# Patient Record
Sex: Male | Born: 1955
Health system: Southern US, Community
[De-identification: ages and names within clinical notes are randomized; demographics above are authoritative.]

## PROBLEM LIST (undated history)

## (undated) DIAGNOSIS — N200 Calculus of kidney: Secondary | ICD-10-CM

## (undated) DIAGNOSIS — Z87442 Personal history of urinary calculi: Secondary | ICD-10-CM

## (undated) DIAGNOSIS — E7800 Pure hypercholesterolemia, unspecified: Secondary | ICD-10-CM

## (undated) DIAGNOSIS — E119 Type 2 diabetes mellitus without complications: Secondary | ICD-10-CM

## (undated) DIAGNOSIS — M1990 Unspecified osteoarthritis, unspecified site: Secondary | ICD-10-CM

## (undated) HISTORY — PX: NECK SURGERY: SHX720

## (undated) HISTORY — PX: KNEE SURGERY: SHX244

## (undated) HISTORY — PX: APPENDECTOMY: SHX54

## (undated) HISTORY — PX: BACK SURGERY: SHX140

---

## 2001-09-11 ENCOUNTER — Inpatient Hospital Stay (HOSPITAL_COMMUNITY): Admission: RE | Admit: 2001-09-11 | Discharge: 2001-09-12 | Payer: Self-pay | Admitting: Neurosurgery

## 2001-09-11 ENCOUNTER — Encounter: Payer: Self-pay | Admitting: Neurosurgery

## 2003-02-09 ENCOUNTER — Encounter: Payer: Self-pay | Admitting: Neurosurgery

## 2003-02-11 ENCOUNTER — Encounter: Payer: Self-pay | Admitting: Neurosurgery

## 2003-02-11 ENCOUNTER — Inpatient Hospital Stay (HOSPITAL_COMMUNITY): Admission: RE | Admit: 2003-02-11 | Discharge: 2003-02-13 | Payer: Self-pay | Admitting: Neurosurgery

## 2005-04-07 ENCOUNTER — Encounter: Admission: RE | Admit: 2005-04-07 | Discharge: 2005-04-07 | Payer: Self-pay | Admitting: Orthopedic Surgery

## 2007-07-09 ENCOUNTER — Ambulatory Visit (HOSPITAL_BASED_OUTPATIENT_CLINIC_OR_DEPARTMENT_OTHER): Admission: RE | Admit: 2007-07-09 | Discharge: 2007-07-09 | Payer: Self-pay | Admitting: Orthopedic Surgery

## 2007-08-06 ENCOUNTER — Ambulatory Visit (HOSPITAL_BASED_OUTPATIENT_CLINIC_OR_DEPARTMENT_OTHER): Admission: RE | Admit: 2007-08-06 | Discharge: 2007-08-06 | Payer: Self-pay | Admitting: Orthopedic Surgery

## 2007-11-21 ENCOUNTER — Ambulatory Visit: Payer: Self-pay | Admitting: Family Medicine

## 2007-11-21 ENCOUNTER — Inpatient Hospital Stay (HOSPITAL_COMMUNITY): Admission: EM | Admit: 2007-11-21 | Discharge: 2007-11-22 | Payer: Self-pay | Admitting: Emergency Medicine

## 2007-11-28 ENCOUNTER — Encounter: Admission: RE | Admit: 2007-11-28 | Discharge: 2007-11-28 | Payer: Self-pay | Admitting: Neurosurgery

## 2010-01-18 ENCOUNTER — Encounter: Admission: RE | Admit: 2010-01-18 | Discharge: 2010-01-18 | Payer: Self-pay | Admitting: Neurosurgery

## 2010-10-23 NOTE — H&P (Signed)
NAME:  Christopher Hayden, Christopher Hayden                  ACCOUNT NO.:  [id number]   MEDICAL RECORD NO.:  [id number]          PATIENT TYPE:  INP   LOCATION:  3739                         FACILITY:  [facility]   PHYSICIAN:  Wayne A. Sheffield Slider, M.D.    DATE OF BIRTH:  [birth year]   DATE OF ADMISSION:  11/21/2007  DATE OF DISCHARGE:                              HISTORY & PHYSICAL   CHIEF. COMPLAINT:  Chest pain.   HISTORY OF PRESENT ILLNESS:  A 55 year old white male with left arm  pain, jaw pain, head pain, face tingling, and malaise; all these started  this morning accompanied by a chest tightness that has come on gradually  over the day and is worse with deep inspiration.  His blood sugar this  morning was 126.  He also has a history of diabetes and hyperlipidemia.  Chest pain and his other symptoms had improved with nitroglycerin and  morphine.  He does have a history of neck surgery and some cervical  degenerative disease.   PAST MEDICAL HISTORY:  1. Diabetes mellitus x10-14 years.  2. Hyperlipidemia.  3. Chronic neck pain from degeneration of neck.  Per the patient, he      has had a stress test which was about 20 years ago, although his      wife thinks it is more like 10 years ago.   PAST SURGICAL HISTORY:  1. Left shoulder surgery in January 2009.  2. Right knee surgery in February 2009.  3. Right shoulder surgery in 2007.  4. Neck surgery in 2003 and 2004 for disk disease.   SOCIAL HISTORY:  The patient lives in [place], [place].  He is  on disability.  He previously worked in [employer].  He is  married.  He denies tobacco, alcohol, or drug use.   FAMILY HISTORY:  Mother has diabetes, and father has history of a heart-  valve surgery but since then, apparently, has not been on any  medications.   REVIEW OF SYSTEMS:  No fevers.  No sweats.  Positive for headache.  Positive for chest pain as above.  Negative for shortness of breath.  Positive for abdominal pain.  Negative for  vomiting and diarrhea.  Negative for dysuria and rash.  Positive for arthralgias which are  chronic.  Negative for vision changes, weakness, or numbness.   ALLERGIES:  1. GLUCOPHAGE.  2. NAPROSYN.   MEDICATIONS:  1. Actos 45 daily.  2. Amaryl 2 mg in the morning and 6 mg at night.  3. Lantus 10 units at bedtime.  4. Pravachol 20 mg daily.  5. Glucosamine/chondroitin daily.   PHYSICAL EXAMINATION:  VITAL SIGNS:  Temperature 98.7, pulse 64 to 92,  respirations 16-18, blood pressure 106 to 128 over 64 to 79, and pulse  ox 100% on room air.  GENERAL:  No acute distress.  HEENT:  Normal external ears, nose, throat, and mouth.  NECK:  Posterior neck scar.  Negative Spurling.  CARDIOVASCULAR:  Regular rate and rhythm.  No murmurs.  No JVD.  LUNGS:  Clear to auscultation bilaterally.  ABDOMEN:  Soft,  nontender, normal bowel sounds.  No hepatosplenomegaly.  BACK:  Nontender over the entire spine.  EXTREMITY:  No edema.  NEUROLOGICAL:  Cranial nerves II through XII intact.  Grossly normal  sensation and movement.  MUSCULOSKELETAL:  The patient is moving all of his extremities.  He has  a posterior neck scar and a negative Spurling test as above.   LABORATORY DATA:  Sodium 139, potassium 4.1, chloride 106, bicarbonate  26, glucose 250, creatinine 0.8, BUN 19, hemoglobin 14.3, hematocrit  42.5, white count 4.8, and platelets 199.  Differential for the CBC was  normal.  Chest x-ray was normal and EKG was normal sinus rhythm.   ASSESSMENT AND PLAN:  A 55 year old white male with history of diabetes  mellitus and hyperlipidemia who presents with chest tightness.  1. Chest pain.  The patient has multiple risk factors; however, I      think he is relatively low risk for acute event at this time,      especially given completely normal workup thus far.  We will go and      treat with aspirin and heparin.  We will hold on beta-blocker      secondary to his relative bradycardia with a pulse of  58 on the      monitor currently.  We will check a cardiac panel every 8 hours x3      and a morning electrocardiogram.  The patient will likely need      outpatient risk stratification.  We will also check a.m. fasting      lipid profile.  2. Diabetes mellitus.  He has home medications.  We will also check a      hemoglobin A1c.  It is an odd regimen, especially of his Amaryl,      but we will defer this management to his primary care physician and      just keep a eye on his CBGs today.  3. Hyperlipidemia.  We will check a fasting lipid panel and continue      the Pravachol.  4. Osteoarthritis.  We will continue glucosamine and chondroitin.   DISPOSITION:  1. Tentative discharge tomorrow if cardiac enzymes are negative.  2. Neck pain:  This may be a manifestation of his neck degenerative      disease that has worsened since his main complaint and symptoms      seem to stem from his left arm pain and tingling et Karie Soda.      Angeline Slim, M.D.  Electronically Signed      Wayne A. Sheffield Slider, M.D.  Electronically Signed    AL/MEDQ  D:  11/21/2007  T:  11/22/2007  Job:  [id number]   cc:   Fredia Beets, M.D.

## 2010-10-23 NOTE — Op Note (Signed)
NAMEMarland Hayden  [name], MALLOZZI                  ACCOUNT NO.:  [id number]   MEDICAL RECORD NO.:  [id number]          PATIENT TYPE:  AMB   LOCATION:  DSC                          FACILITY:  [facility]   PHYSICIAN:  Rodney A. Mortenson, M.D.DATE OF BIRTH:  1955/08/30   DATE OF PROCEDURE:  08/06/2007  DATE OF DISCHARGE:                               OPERATIVE REPORT   JUSTIFICATION:  A 55 year old male who first injured his right knee on a  boat ramp on December 26, 2006.  He had tenderness along the medial joint  line, initially thought to be a mild contusion.  He continues to have  diffuse discomfort about the knee.  Occasionally, the knee will catch  and pop on him and this seems to relieve his discomfort.  He has had  intermittent problems with the knee and examination shows no significant  joint line tenderness.  There is a small medial plica which is  nontender.  He has crepitus behind the patella.  Because of persistent  pain and discomfort, an MRI of the knee was obtained and this shows a  grade 3 chondromalacia of the patellofemoral junction and medial  compartments.  There are 2, possibly 3 loose bodies in the joint.  No  discrete meniscal tears are seen.  Because of persistent pain and  discomfort, it is felt that arthroscopic evaluation and treatment are  indicated and necessary.  Complication were discussed preoperatively.  Questions answered and encouraged and the patient wishes to proceed.   JUSTIFICATION FOR OUTPATIENT SURGERY:  Minimal morbidity.   PREOPERATIVE DIAGNOSIS:  Loose bodies, right knee; chondromalacia,  patellofemoral junction.   POSTOPERATIVE DIAGNOSES:  1. Multiple horizontal tears, posterior horn of medial meniscus, right      knee.  2. Early osteoarthritis, trochlear notch, with some full-thickness      loss of cartilage and trochlear notch, right femur, chondromalacia      patella, small cartilaginous loose bodies.   OPERATION:  Arthroscopy, chondroplasty of patella,  chondroplasty of the  femoral notch area and debridement of loose flaking cartilage from  trochlear area.  A partial medial meniscectomy with debridement of  posterior horn of medial meniscus and chondroplasty of medial femoral  condyle.   SURGEON:  Lenard Galloway. Chaney Malling, M.D.   ANESTHESIA:  General.   PATHOLOGY:  With the arthroscopy in the knee, a very careful examination  of the knee was undertaken.  There was grade 2 chondromalacia in the  posterior aspect of the patella, but in the femoral notch area, there  were 2 large vertical troughs of total loss of articular cartilage loss.  In the interval between the 2 vertical troughs, there is grade 2 and  grade 3 cartilage damage.  The anterior cruciate ligament was normal.  In the lateral compartment, there was normal articular cartilage of the  lateral femoral condyle, the lateral tibial plateau and the entire  circumference of the lateral meniscus was normal, but there were 2 small  cartilaginous loose bodies in this area.  In the medial compartment,  there were multiple horizontal tears of the posterior horn of  the medial  meniscus and grade 2 and grade 3 cartilage damage off the medial femoral  condyle.   PROCEDURE:  The patient was placed on the operating table in a supine  position with a pneumatic tourniquet about the right upper thigh.  The  right leg was placed in a leg holder and the entire right lower  extremity prepped with DuraPrep and draped out in the usual manner.  An  infusion cannula was placed in the superomedial pouch and the knee  distended with saline.  Anteromedial and anterolateral portals were made  and the arthroscope was introduced.  Attention was first turned to the  posterior aspect of the patella, which was debrided with a full-radius  dissector.  There was significant fraying and tearing of articular  cartilage and trochlear notch here and this was also very aggressively  debrided.  There were some areas  where there was total loss of articular  cartilage and exposed raw bone.  In the lateral compartment, several  small cartilaginous loose bodies were seen and these were removed with  the intra-articular shaver.  Attention was then turned to the medial  compartment.  Through both medial and lateral portals, a series of  baskets were inserted and the posterior third of the posterior  attachment was debrided with baskets; this was followed with the intra-  articular shaver.  The remaining rim was then smoothed and balanced with  a nice transition to the anterior two-thirds of the medial meniscus,  which appeared normal.  A chondroplasty shaver was then used to clean  off the medial femoral condyle.  The knee was then filled with Marcaine  and a large bulky pressure dressing was applied.  The patient returned  to the recovery room in excellent condition.  Technically, this  procedure went extremely well.   DISPOSITION:  1. To my office on Friday.  2. Percocet for pain.  3. Usual postoperative instructions were given.      Rodney A. Chaney Malling, M.D.  Electronically Signed     RAM/MEDQ  D:  08/06/2007  T:  08/07/2007  Job:  [id number]

## 2010-10-23 NOTE — Op Note (Signed)
NAMEMarland Kitchen  Christopher Hayden, Christopher Hayden                  ACCOUNT NO.:  [id number]   MEDICAL RECORD NO.:  [id number]          PATIENT TYPE:  AMB   LOCATION:  DSC                          FACILITY:  [facility]   PHYSICIAN:  Rodney A. Mortenson, M.D.DATE OF BIRTH:  07/22/1955   DATE OF PROCEDURE:  07/09/2007  DATE OF DISCHARGE:                               OPERATIVE REPORT   JUSTIFICATION:  55 year old male who has been having trouble with his  left shoulder for a number of months.  He has been treated  conservatively and has never had complete resolution of pain and the  problem continuous.  An MRI was obtained and this shows moderate  anterior supraspinatus tendinopathy with AC joint arthropathy.  He has  good strength, internal and external rotation, no tenderness over the [facility]  joint, minimal tenderness of the biceps but impingement testing is very  painful.  It was felt that he had an impingement syndrome to the  shoulder and that surgical intervention was indicated necessary.  Complications were discussed preoperatively.  Questions were answered  and encouraged and the patient wishes to proceed.   JUSTIFICATION FOR OUTPATIENT SURGERY:  Minimal morbidity.   PREOPERATIVE DIAGNOSIS:  1. Impingement syndrome, left shoulder.  2. Supraspinatus tendinopathy  3. Rule out internal derangement glenohumeral joint.   POSTOPERATIVE DIAGNOSIS:  Frayed superior labrum, left shoulder;  chondral defect humeral head; supraspinatus tendinopathy secondary to  impingement syndrome, left shoulder.   OPERATION:  Arthroscopic debridement of labrum; subacromial  decompression arthroscopically and distal clavicle resection on inferior  surface with removal of inferior bone spurs.   SURGEON:  Lenard Galloway. Chaney Malling, M.D.   ANESTHESIA:  General.   PROCEDURE:  The patient was placed on the operating table in a supine  position.  After satisfactory general anesthesia, was placed in the semi-  sitting position.  The left upper  extremity and shoulder was then  prepped with DuraPrep and draped out in the usual manner.  Through a  standard posterior portal, an arthroscope was introduced and a standard  anterior portal for instrumentation was done.  A very careful  examination of the shoulder was done.  The biceps appeared absolutely  normal.  The subscapularis was normal.  The anterior and inferior labrum  was normal, but the superior labrum was frayed and torn but this was  stable and the biceps was absolutely normal.  No injury or fraying or  tearing of the supraspinatus could be seen.  There was a chondral defect  about the humeral head.  The arthroscope was then placed in the  subacromial space and evaluated.  As noted previously, the torn labrum  was debrided.  In the subacromial space, there was some prominence of  the inferior aspect of the distal clavicle.  Using the ArthroCare wand,  the soft tissue was debrided back out of the way exposing bone at the  distal end of the clavicle and the acromion.  The borders of the  acromion were very clearly seen.  A 6 mm bur was then inserted and a  subacromial decompression was done with the bur.  Excellent  decompression was achieved.  There was significant fraying of the bursal  surface of the supraspinatus but no full thickness tear could be seen.  Next, attention was turned to the under surface of the distal clavicle  and this was also debrided with a 6 mm bur.  The bone spurs at this  level were then debrided back and debulked.  Once this was completed,  there was excellent decompression of the cuff itself.  The arthroscope  was removed.  The skin was closed with 3-0 nylon suture, a sterile  dressing was applied, and the patient returned to the recovery room in  excellent condition.  Technically, this went extremely well.   DISPOSITION:  1. Percocet for pain.  2. To my office next week.  3. The usual postop instructions were given.  4. Start physical therapy  when he is seen in the office.      Rodney A. Chaney Malling, M.D.  Electronically Signed     RAM/MEDQ  D:  07/09/2007  T:  07/09/2007  Job:  [id number]

## 2010-10-23 NOTE — Discharge Summary (Signed)
NAME:  Christopher Hayden, Christopher Hayden                  ACCOUNT NO.:  [id number]   MEDICAL RECORD NO.:  [id number]          PATIENT TYPE:  INP   LOCATION:  3739                         FACILITY:  [facility]   PHYSICIAN:  Wayne A. Sheffield Slider, M.D.    DATE OF BIRTH:  02/12/1956   DATE OF ADMISSION:  11/21/2007  DATE OF DISCHARGE:  11/22/2007                               DISCHARGE SUMMARY   DISCHARGE DIAGNOSES:  1. Bilateral jaw pain with left arm pain and tingling as well as chest      tightness likely secondary to cervical spine pathology.  2. Diabetes.  3. Hypercholesterolemia.  4. Arthritis.  5. History of multiple shoulder, knee, and neck surgeries.  6. Chronic neck pain.   DISCHARGE MEDICATIONS:  1. Actos 2 mg in the morning and 6 mg at night.  2. Lantus 10 units at bedtime.  3. Pravachol 20 mg daily.  4. Aleve 2 tablets in the morning and 2 tablets at night.   FOLLOWUP INSTRUCTIONS:  The patient is to follow up with Dr. Channing Mutters in [place] regarding his chronic neck pain.  His primary care physician is  Dr. Lendon Colonel in [facility] and he will call him for a followup appointment.   HOSPITAL COURSE:  This is a 55 year old white male with a history of  diabetes, hyperlipidemia, and chronic neck pain who was admitted to the  hospital for a 1-day history of left arm pain, jaw pain, face tingling,  and some mild chest tightness upon deep inspiration.  Given his coronary  artery risk factors, he was admitted to the hospital to rule out  myocardial infarction.  1. Rule out MI.  The patient had an EKG on admission and again the      next morning that both showed sinus bradycardia but were otherwise      normal.  He had cardiac enzymes that were negative x3 sets.  He was      initially started on a heparin drip for chest pain, but this was      discontinued on the morning of discharge given his negative workup.  2. Diabetes.  The patient was continued on his home medicines of      Lantus and Amaryl with a sensitive  sliding scale regimen.  He had      adequate glucose control throughout admission without any      hypoglycemia.  3. Hyperlipidemia.  He was continued on his home dose of Pravachol.  A      fasting lipid panel was obtained that showed HDL of 49, LDL of 96,      and triglycerides of 34.  4. Chronic neck pain.  The patient is followed by Dr. Channing Mutters and has had      neck surgeries in 2003 and 2004 for a ruptured disk.  He follows      with Dr. Channing Mutters every 6 months and actually just saw him the week      prior to admission after a fall that occurred on June 8.  He has      had  increasing pain in his neck since that fall on June 8 and has      been taking Aleve 2 tablets in the morning and 2 tablets at night.      He is unable to tolerate most of narcotic pain medications due to      either sedation and nausea.  The patient says that Dr. Channing Mutters plans to      obtain another MRI of      his neck if his pain does not get better with Aleve after this most      recent on.  It is highly suspected that the left arm pain, jaw      pain, and head pain could possibly be a cervical disk pathology.      The patient will need close follow up with Dr. Channing Mutters for better pain      control.      Sylvan Cheese, M.D.  Electronically Signed      Wayne A. Sheffield Slider, M.D.  Electronically Signed    MJ/MEDQ  D:  11/22/2007  T:  11/22/2007  Job:  [id number]   cc:   Fredia Beets, M.D.  Payton Doughty, M.D.

## 2010-10-26 NOTE — Op Note (Signed)
   NAME:  Christopher Hayden, Christopher Hayden                            ACCOUNT NO.:  [id number]   MEDICAL RECORD NO.:  [id number]                   PATIENT TYPE:  INP   LOCATION:  2896                                 FACILITY:  [facility]   PHYSICIAN:  Payton Doughty, M.D.                   DATE OF BIRTH:  Dec 26, 1955   DATE OF PROCEDURE:  02/11/2003  DATE OF DISCHARGE:                                 OPERATIVE REPORT   PREOPERATIVE DIAGNOSIS:  Nonunion C5-6.   POSTOPERATIVE DIAGNOSIS:  Nonunion C5-6.   PROCEDURE:  C5-6 posterior cervical fusion with lateral mass screws.   SURGEON:  Payton Doughty, M.D.   ANESTHESIA:  General endotracheal anesthesia.   PREPARATION:  Alcohol wipe.   COMPLICATIONS:  None.   ASSISTANT:  Clydene Fake, M.D.   INDICATIONS FOR PROCEDURE:  This is a 55 year old gentleman who had a fusion  16 months ago and now has a nonunion at C5-6.   DESCRIPTION OF PROCEDURE:  The patient was taken to the operating room and  intubated. He was placed prone in the [place] head holder. Following  shaving, prepping and draping in the usual sterile fashion the skin was  incised from the bottom of C4 to the top of C7 and the lamina of C5 and C6  were exposed bilaterally in a subperiosteal plane. Interoperative x-ray  confirmed the correctness of the level.   After confirming the correctness of the level  the bone was scraped free of  periosteum. Lateral mass screws were then placed using the standard  coordinates. They were 1 cm long and 2.5 mm diameter. Interoperative x-rays  showed good placement of screws. They were connected with a rod and cap.   Then DBX mixed with bone croutons was placed as a laminar and facet graft.  The wound was irrigated. Hemostasis was assured. The fascia was  reapproximated with 0 Vicryl in an interrupted fashion. The subcutaneous  tissue was reapproximated with 0 Vicryl in an interrupted fashion. The  subcuticular tissue  was reapproximated with 3-0 Vicryl in an  interrupted  fashion. The skin was closed with 3-0 nylon in a running locked fashion.  Betadine and Telfa dressing were applied and then closed with OpSite. The  patient was placed in an Aspen collar and returned to the recovery room in  good condition.                                                Payton Doughty, M.D.   MWR/MEDQ  D:  02/11/2003  T:  02/12/2003  Job:  [id number]

## 2010-10-26 NOTE — Op Note (Signed)
[place]  Patient:    JOHNTHAN, AXTMAN Visit Number: [id number] MRN: [id number]          Service Type: SUR Location: [id number] Attending Physician:  Emeterio Reeve Dictated by:   Payton Doughty, M.D. Proc. Date: 09/11/01 Admit Date:  09/11/2001                             Operative Report  PREOPERATIVE DIAGNOSIS:  Herniated disk, C5-6.  POSTOPERATIVE DIAGNOSIS:  Herniated disk, C5-6.  PROCEDURE:  C5-6 anterior cervical diskectomy and fusion with Tether plate.  SURGEON:  Payton Doughty, M.D.  NURSE ASSISTANT:  [facility]  DOCTOR ASSISTANT:  Tanya Nones. Jeral Fruit, M.D.  ANESTHESIA:  General endotracheal.  PREPARATION:  Sterile Betadine prep and scrub with alcohol wipe.  COMPLICATIONS:  None.  DESCRIPTION OF PROCEDURE:  A 55 year old right-handed white gentleman with herniated disk at 5-6 and a left C6 radiculopathy.  He was taken to the operating room and smoothly anesthetized and intubated, placed supine on the operating table in the Holter head traction.  Following shave, prep, and drape in the usual sterile fashion, the skin was incised in the midline in the medial border of the sternocleidomastoid muscle on the left side.  The platysma was identified, elevated, divided, and undermined.  The sternocleidomastoid was identified, and medial dissection revealed the carotid artery retracted laterally to the left, trachea and esophagus retracted laterally to the right, exposing the bones of the anterior cervical spine.  A marker was placed, intraoperative x-ray obtained to confirm correctness of level.  Having confirmed correctness of the level, the longus colli was taken down bilaterally to the extent that it allowed visualization of the majority of the anterior curvature of the vertebral body.  Diskectomy at C5-6 was then carried out under gross observation.  The operating microscope was brought in and microdissection technique used to  dissect the remaining disk to free up the neural foramina and dissect the anterior epidural space.  On the left side there was a herniated disk compressing the origin of the left C6 root.  This was removed without difficulty.  The root was explored down to the neural foramen and found to be open.  On the right side there was no encumbering disk.  Having completed decompression, a 7 mm bone graft was fashioned from patellar allograft and tapped into place.  A 14 mm Tether plate was then placed with 13 mm screws, two in C5 and two in C6.  Intraoperative x-ray showed good placement of bone graft, plate, and screws.  The wound was irrigated and hemostasis assured.  The wound was reapproximated with 3-0 Vicryl in interrupted fashion, subcutaneous tissue was reapproximated with 3-0 Vicryl in interrupted fashion, and the skin was closed with 4-0 Vicryl in a running subcuticular fashion.  Benzoin and Steri-Strips were applied and made occlusive with Telfa and OpSite.  The patient then placed in an Aspen collar and returned to the recovery room in good condition. Dictated by:   Payton Doughty, M.D. Attending Physician:  Emeterio Reeve DD:  09/11/01 TD:  09/12/01 Job: [id number]

## 2010-10-26 NOTE — H&P (Signed)
NAME:  Christopher Hayden, Christopher Hayden                            ACCOUNT NO.:  [id number]   MEDICAL RECORD NO.:  [id number]                   PATIENT TYPE:  INP   LOCATION:  3005                                 FACILITY:  [facility]   PHYSICIAN:  Payton Doughty, M.D.                   DATE OF BIRTH:  1955-08-07   DATE OF ADMISSION:  02/11/2003  DATE OF DISCHARGE:                                HISTORY & PHYSICAL   ADMITTING DIAGNOSIS:  Nonunion C5-C6.   SERVICE:  Neurosurgery.   HISTORY OF PRESENT ILLNESS:  This is a 55 year old, right handed, white  gentleman who underwent a diskectomy in April 2003, did well and came back  to my office about a month ago with increasing pain in his neck and  headaches at the back of his neck.  Plain films and MR showed nonunion at C5-  C6 and he is going to undergo a posterior augmentation of his C5-C6 fusion.   MEDICAL HISTORY:  Remarkable for adult onset diabetes.   MEDICATIONS:  1. He takes Avandia 8 mg a day.  2. Wellbutrin 150 mg a day.  3. Chlorpropamide 250 mg a day.  4. Pravachol 20 mg a day.   ALLERGIES:  1. He is sensitive to NAPROSYN  2. GLUCOPHAGE.  3. ZETIA.   PAST SURGICAL HISTORY:  1. Appendectomy, in 1972.  2. Carpal tunnel, in 1985.   SOCIAL HISTORY:  He does not smoke.  He does not drink.  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   FAMILY HISTORY:  Mom is 77.  Dad is 25.  They are alive, histories are not  given.   REVIEW OF SYSTEMS:  Remarkable for glasses, sinus headaches, leg pain while  he is walking, hematuria, arm weakness, leg weakness, back pain, joint pain,  arthritis, neck pain, diabetes.   PHYSICAL EXAMINATION:  HEENT:  Within normal limits.  NECK:  He has reasonable range of motion of his neck with no crepitus.  CHEST:  Clear.  CARDIAC:  Regular, rate and rhythm.  ABDOMEN:  Nontender with no hepatosplenomegaly.  EXTREMITIES:  Without clubbing or cyanosis. Peripheral pulses are good.  GU:  Deferred.  NEUROLOGIC:  He is awake,  alert and oriented.  Cranial nerves are intact.  Motor exam shows 5/5 strength throughout the upper and lower extremities at  this time.  There is no current sensory deficit.  Deep tendon reflexes are 2  at the biceps, 1 at the triceps, 1 at the brachialis bilaterally.  Lower  extremities are nonmyelopathic.   IMAGING STUDIES:  Plain films and MR demonstrate a probable nonunion C5-C6.   CLINICAL IMPRESSION:  Nonunion C5-C6.   PLAN:  Posterior augmentation infusion.   The risks and benefits of this approach have been discussed with him and he  wishes to proceed.  Payton Doughty, M.D.    MWR/MEDQ  D:  02/11/2003  T:  02/11/2003  Job:  [id number]

## 2010-10-26 NOTE — H&P (Signed)
[place]  Patient:    Christopher Hayden, Christopher Hayden Visit Number: [id number] MRN: [id number]          Service Type: SUR Location: 3000 [id number] Attending Physician:  Emeterio Reeve Dictated by:   Payton Doughty, M.D. Admit Date:  09/11/2001 Discharge Date: 09/12/2001                           History and Physical  ADMITTING DIAGNOSIS:  Herniated disk at 5-6 on the left side.  SERVICE:  Neurosurgery.  HISTORY OF PRESENT ILLNESS:  [age]-year-old right-handed white gentleman who last summer began experiencing pain in his left arm; it started when he was moving objects and he noted he was having weakness of his biceps.  He visited orthopedic doctors and did not get any better.  MRI of the arm and shoulder was unremarkable.  MRI of the neck showed a disk eccentric to the left at C5-6 and he was referred to me.  MEDICAL HISTORY:  Remarkable for adult-onset diabetes.  MEDICATIONS: 1. Avandia 8 mg a day. 2. Wellbutrin 150 mg a day. 3. Chlorpropamide 250 mg a day. 4. Pravachol 20 mg a day.  ALLERGIES:  He is sensitive to NAPROSYN, GLUCOPHAGE and ZETIA.  SURGICAL HISTORY:  Appendectomy in 1972, carpal tunnel in 1980 or 1985.  SOCIAL HISTORY:  He does not smoke and does not drink.  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  FAMILY HISTORY:  His mom is 1, daddy is 54; histories are not given.  REVIEW OF SYSTEMS:  Remarkable for glasses, sinus headaches, leg pain while he is walking, hematuria, arm weakness, leg weakness, back pain, joint pain, arthritis, neck pain and diabetes.  PHYSICAL EXAMINATION:  HEENT:  Within normal limits.   NECK:  He has reasonable range of motion in his neck with no reproduction of his left arm symptoms.  CHEST:  Clear.  CARDIAC:  Regular rate and rhythm.  ABDOMEN:  Nontender with no hepatosplenomegaly.  EXTREMITIES:  Without clubbing or cyanosis.  GU:  Deferred.  PERIPHERAL PULSES:  Good.  NEUROLOGIC:  He is  awake, alert and oriented.  His cranial nerves are intact. Motor exam shows 5/5 strength throughout the upper extremities, save for slight weakness of the left biceps.  There is no tenderness of the biceps or extensor muscle mass and finger extensors are without discomfort in the extensor muscle mass.  Reflexes are 2 at the right biceps, 1 at the left, 1 at the brachioradialis and 1 at the triceps bilaterally.  Lower extremities are non-myelopathic.  LABORATORY AND ACCESSORY DATA:  MRI shows a spondylitic change at 3-4, a disk at 5-6, eccentric to the left, and a small central disk at 6-7.  CLINICAL IMPRESSION:  Mild left C6 radiculopathy related to herniated disk.  These have been bothering him for a while and wants to get it taken care of.  PLAN:  The plan is for an anterior cervical diskectomy and fusion at C5-C6 with a tethered plate.  The risks and benefits of this approach have been discussed with him and he wishes to proceed. Dictated by:   Payton Doughty, M.D. Attending Physician:  Emeterio Reeve DD:  09/11/01 TD:  09/11/01 Job: [id number]

## 2011-02-28 LAB — BASIC METABOLIC PANEL
BUN: 14
Creatinine, Ser: 0.88
GFR calc non Af Amer: >60
Glucose, Bld: 255 — ABNORMAL HIGH
Potassium: 4.2

## 2011-03-01 LAB — BASIC METABOLIC PANEL
BUN: 14
Creatinine, Ser: 0.84
GFR calc non Af Amer: >60
Glucose, Bld: 93
Potassium: 4.5

## 2011-03-07 LAB — POCT I-STAT, CHEM 8
BUN: 19
Calcium, Ion: 1.13
Chloride: 106
Glucose, Bld: 250 — ABNORMAL HIGH

## 2011-03-07 LAB — BASIC METABOLIC PANEL
BUN: 17
Chloride: 103
Glucose, Bld: 230 — ABNORMAL HIGH
Potassium: 4.7

## 2011-03-07 LAB — CARDIAC PANEL(CRET KIN+CKTOT+MB+TROPI)
Relative Index: 1.1
Troponin I: <0.01
Troponin I: <0.01

## 2011-03-07 LAB — APTT: aPTT: 29

## 2011-03-07 LAB — CBC
HCT: 38.4 — ABNORMAL LOW
HCT: 42.5
Hemoglobin: 14.3
MCHC: 33.8
Platelets: 177
Platelets: 199
RDW: 14
WBC: 6.9

## 2011-03-07 LAB — LIPID PANEL
Cholesterol: 152
HDL: 49

## 2011-03-07 LAB — TROPONIN I: Troponin I: <0.01

## 2011-03-07 LAB — DIFFERENTIAL
Basophils Absolute: 0
Basophils Relative: 0
Eosinophils Relative: 3
Monocytes Absolute: 0.5
Neutro Abs: 3.1

## 2011-03-07 LAB — PROTIME-INR
INR: 1
Prothrombin Time: 13.7

## 2011-03-07 LAB — HEPARIN LEVEL (UNFRACTIONATED)
Heparin Unfractionated: 0.87 — ABNORMAL HIGH
Heparin Unfractionated: 0.95 — ABNORMAL HIGH

## 2013-06-18 ENCOUNTER — Emergency Department (HOSPITAL_BASED_OUTPATIENT_CLINIC_OR_DEPARTMENT_OTHER): Payer: Managed Care, Other (non HMO)

## 2013-06-18 ENCOUNTER — Encounter (HOSPITAL_BASED_OUTPATIENT_CLINIC_OR_DEPARTMENT_OTHER): Payer: Self-pay | Admitting: Emergency Medicine

## 2013-06-18 ENCOUNTER — Emergency Department (HOSPITAL_BASED_OUTPATIENT_CLINIC_OR_DEPARTMENT_OTHER)
Admission: EM | Admit: 2013-06-18 | Discharge: 2013-06-19 | Disposition: A | Discharge status: Home / Self Care | Payer: Managed Care, Other (non HMO) | Attending: Emergency Medicine | Admitting: Emergency Medicine

## 2013-06-18 DIAGNOSIS — Z794 Long term (current) use of insulin: Secondary | ICD-10-CM | POA: Insufficient documentation

## 2013-06-18 DIAGNOSIS — E119 Type 2 diabetes mellitus without complications: Secondary | ICD-10-CM | POA: Insufficient documentation

## 2013-06-18 DIAGNOSIS — Z7982 Long term (current) use of aspirin: Secondary | ICD-10-CM | POA: Insufficient documentation

## 2013-06-18 DIAGNOSIS — R112 Nausea with vomiting, unspecified: Secondary | ICD-10-CM | POA: Insufficient documentation

## 2013-06-18 DIAGNOSIS — R1011 Right upper quadrant pain: Secondary | ICD-10-CM

## 2013-06-18 DIAGNOSIS — Z87442 Personal history of urinary calculi: Secondary | ICD-10-CM | POA: Insufficient documentation

## 2013-06-18 DIAGNOSIS — Z9089 Acquired absence of other organs: Secondary | ICD-10-CM | POA: Insufficient documentation

## 2013-06-18 DIAGNOSIS — R6883 Chills (without fever): Secondary | ICD-10-CM | POA: Insufficient documentation

## 2013-06-18 DIAGNOSIS — Z79899 Other long term (current) drug therapy: Secondary | ICD-10-CM | POA: Insufficient documentation

## 2013-06-18 HISTORY — DX: Type 2 diabetes mellitus without complications: E11.9

## 2013-06-18 HISTORY — DX: Calculus of kidney: N20.0

## 2013-06-18 LAB — URINE MICROSCOPIC-ADD ON

## 2013-06-18 LAB — URINALYSIS, ROUTINE W REFLEX MICROSCOPIC
BILIRUBIN URINE: NEGATIVE
Glucose, UA: NEGATIVE mg/dL
KETONES UR: NEGATIVE mg/dL
LEUKOCYTES UA: NEGATIVE
NITRITE: NEGATIVE
Protein, ur: NEGATIVE mg/dL
Specific Gravity, Urine: 1.021 (ref 1.005–1.030)
UROBILINOGEN UA: 0.2 mg/dL (ref 0.0–1.0)
pH: 5 (ref 5.0–8.0)

## 2013-06-18 LAB — COMPREHENSIVE METABOLIC PANEL
ALBUMIN: 4.6 g/dL (ref 3.5–5.2)
ALT: 54 U/L — ABNORMAL HIGH (ref 0–53)
AST: 41 U/L — AB (ref 0–37)
Alkaline Phosphatase: 51 U/L (ref 39–117)
BUN: 22 mg/dL (ref 6–23)
CHLORIDE: 101 meq/L (ref 96–112)
CO2: 30 mEq/L (ref 19–32)
CREATININE: 1 mg/dL (ref 0.50–1.35)
Calcium: 9.9 mg/dL (ref 8.4–10.5)
GFR calc Af Amer: >90 mL/min (ref >90)
GFR calc non Af Amer: 80 mL/min — ABNORMAL LOW (ref >90)
Glucose, Bld: 99 mg/dL (ref 70–99)
Potassium: 3.6 mEq/L — ABNORMAL LOW (ref 3.7–5.3)
Sodium: 144 mEq/L (ref 137–147)
TOTAL PROTEIN: 7.8 g/dL (ref 6.0–8.3)
Total Bilirubin: 0.6 mg/dL (ref 0.3–1.2)

## 2013-06-18 LAB — CBC WITH DIFFERENTIAL/PLATELET
BASOS ABS: 0 10*3/uL (ref 0.0–0.1)
Basophils Relative: 0 % (ref 0–1)
EOS PCT: 1 % (ref 0–5)
Eosinophils Absolute: 0.1 10*3/uL (ref 0.0–0.7)
HCT: 49.1 % (ref 39.0–52.0)
Hemoglobin: 16.6 g/dL (ref 13.0–17.0)
Lymphocytes Relative: 15 % (ref 12–46)
Lymphs Abs: 2 10*3/uL (ref 0.7–4.0)
MCH: 31.2 pg (ref 26.0–34.0)
MCHC: 33.8 g/dL (ref 30.0–36.0)
MCV: 92.3 fL (ref 78.0–100.0)
Monocytes Absolute: 0.9 10*3/uL (ref 0.1–1.0)
Monocytes Relative: 6 % (ref 3–12)
NEUTROS ABS: 10.7 10*3/uL — AB (ref 1.7–7.7)
Neutrophils Relative %: 78 % — ABNORMAL HIGH (ref 43–77)
Platelets: 311 10*3/uL (ref 150–400)
RBC: 5.32 MIL/uL (ref 4.22–5.81)
RDW: 12.8 % (ref 11.5–15.5)
WBC: 13.7 10*3/uL — ABNORMAL HIGH (ref 4.0–10.5)

## 2013-06-18 LAB — GLUCOSE, CAPILLARY
Glucose-Capillary: 115 mg/dL — ABNORMAL HIGH (ref 70–99)
Glucose-Capillary: 74 mg/dL (ref 70–99)

## 2013-06-18 LAB — LIPASE, BLOOD: Lipase: 20 U/L (ref 11–59)

## 2013-06-18 MED ORDER — ONDANSETRON HCL 4 MG/2ML IJ SOLN
INTRAMUSCULAR | Status: AC
  Filled 2013-06-18: qty 2

## 2013-06-18 MED ORDER — DEXTROSE 50 % IV SOLN
INTRAVENOUS | Status: AC
  Filled 2013-06-18: qty 50

## 2013-06-18 MED ORDER — HYDROMORPHONE HCL PF 1 MG/ML IJ SOLN
1.0000 mg | Freq: Once | INTRAMUSCULAR | Status: AC
  Administered 2013-06-18: 1 mg via INTRAVENOUS
  Filled 2013-06-18: qty 1

## 2013-06-18 MED ORDER — ONDANSETRON HCL 4 MG/2ML IJ SOLN
4.0000 mg | Freq: Once | INTRAMUSCULAR | Status: AC
  Administered 2013-06-18: 4 mg via INTRAVENOUS

## 2013-06-18 MED ORDER — IOHEXOL 300 MG/ML  SOLN
50.0000 mL | Freq: Once | INTRAMUSCULAR | Status: AC | PRN
  Administered 2013-06-18: 50 mL via ORAL

## 2013-06-18 MED ORDER — IOHEXOL 300 MG/ML  SOLN
100.0000 mL | Freq: Once | INTRAMUSCULAR | Status: AC | PRN
  Administered 2013-06-18: 100 mL via INTRAVENOUS

## 2013-06-18 MED ORDER — SODIUM CHLORIDE 0.9 % IV BOLUS (SEPSIS)
1000.0000 mL | Freq: Once | INTRAVENOUS | Status: AC
  Administered 2013-06-18: 1000 mL via INTRAVENOUS

## 2013-06-18 NOTE — ED Provider Notes (Signed)
CSN: [id number]     Arrival date & time 06/18/13  2113 History  This chart was scribed for Christopher B. Karle Starch, MD by Rolanda Lundborg, ED Scribe. This patient was seen in room MH07/MH07 and the patient's care was started at 9:32 PM.   Chief Complaint  Patient presents with  . Abdominal Pain   The history is provided by the patient. No language interpreter was used.   HPI Comments: TAINO MAERTENS is a 58 y.o. male with a h/o kidney stones and DM who presents to the Emergency Department complaining of sudden-onset RLQ abdominal pain that radiates to his back that started at 6:30 tonight with nausea, vomiting, and chills. Pt states that eating made the pain worse. He denies dysuria. He has a h/o appendectomy. He does not drink alcohol.    Past Medical History  Diagnosis Date  . Diabetes mellitus without complication   . Kidney stone    No past surgical history on file. No family history on file. History  Substance Use Topics  . Smoking status: Not on file  . Smokeless tobacco: Not on file  . Alcohol Use: Not on file    Review of Systems  Constitutional: Positive for chills.  Gastrointestinal: Positive for nausea, vomiting and abdominal pain.  Genitourinary: Negative for dysuria.   A complete 10 system review of systems was obtained and all systems are negative except as noted in the HPI and PMH.   Allergies  Meloxicam; Metformin and related; Naproxen; and Zetia  Home Medications   Current Outpatient Rx  Name  Route  Sig  Dispense  Refill  . aspirin (ASPIRIN EC) 81 MG EC tablet   Oral   Take 81 mg by mouth daily. Swallow whole.         . insulin aspart (NOVOLOG) 100 UNIT/ML injection   Subcutaneous   Inject into the skin 3 (three) times daily with meals.         . insulin glargine (LANTUS) 100 UNIT/ML injection   Subcutaneous   Inject 26 Units into the skin at bedtime.         . pravastatin (PRAVACHOL) 40 MG tablet   Oral   Take 40 mg by mouth at bedtime.           BP 133/73  Pulse 108  Temp(Src) 98 F (36.7 C) (Oral)  Resp 20  Ht 5\' 10"  (1.778 m)  Wt 192 lb (87.091 kg)  BMI 27.55 kg/m2  SpO2 99% Physical Exam  Nursing note and vitals reviewed. Constitutional: He is oriented to person, place, and time. He appears well-developed and well-nourished.  HENT:  Head: Normocephalic and atraumatic.  Eyes: EOM are normal. Pupils are equal, round, and reactive to light.  Neck: Normal range of motion. Neck supple.  Cardiovascular: Normal rate, normal heart sounds and intact distal pulses.   Pulmonary/Chest: Effort normal and breath sounds normal.  Abdominal: Bowel sounds are normal. He exhibits no distension. There is tenderness in the right upper quadrant. There is positive Murphy's sign. There is no tenderness at McBurney's point.  Musculoskeletal: Normal range of motion. He exhibits no edema and no tenderness.  Neurological: He is alert and oriented to person, place, and time. He has normal strength. No cranial nerve deficit or sensory deficit.  Skin: Skin is warm and dry. No rash noted.  Psychiatric: He has a normal mood and affect.    ED Course  Procedures (including critical care time) Medications  sodium chloride 0.9 % bolus 1,000  mL (1,000 mLs Intravenous New Bag/Given 06/18/13 2147)  dextrose 50 % solution (not administered)  HYDROmorphone (DILAUDID) injection 1 mg (1 mg Intravenous Given 06/18/13 2147)  ondansetron (ZOFRAN) injection 4 mg (4 mg Intravenous Given 06/18/13 2147)    DIAGNOSTIC STUDIES: Oxygen Saturation is 99% on RA, normal by my interpretation.    COORDINATION OF CARE: 9:39 PM- Discussed treatment plan with pt which includes basic labs and injection of Zofran and Dilaudid. Pt agrees to plan.  10:12 PM - Pt's vomiting has resolved and pt reports his pain is significantly reduced. Will order CT abdomen.     Labs Review Labs Reviewed  URINALYSIS, ROUTINE W REFLEX MICROSCOPIC - Abnormal; Notable for the following:    Hgb  urine dipstick TRACE (*)    All other components within normal limits  CBC WITH DIFFERENTIAL - Abnormal; Notable for the following:    WBC 13.7 (*)    Neutrophils Relative % 78 (*)    Neutro Abs 10.7 (*)    All other components within normal limits  COMPREHENSIVE METABOLIC PANEL - Abnormal; Notable for the following:    Potassium 3.6 (*)    AST 41 (*)    ALT 54 (*)    GFR calc non Af Amer 80 (*)    All other components within normal limits  URINE MICROSCOPIC-ADD ON - Abnormal; Notable for the following:    Squamous Epithelial / LPF FEW (*)    All other components within normal limits  LIPASE, BLOOD  GLUCOSE, CAPILLARY   Imaging Review No results found.  EKG Interpretation   None       MDM  No diagnosis found. Care signed out to Dr Florina Ou at change of shift  I personally performed the services described in this documentation, which was scribed in my presence. The recorded information has been reviewed and is accurate.  Christopher B. Karle Starch, MD 06/23/13 [phone]

## 2013-06-18 NOTE — ED Notes (Signed)
Pt states that his right flank hurts. Pt states that the pain starts mid abdomen and radiates to his back. Pt states that he ate and the pain got worse. Pt states that he vomited several times and the pain gets slightly better. Pt denies problems with urination (does have hx of kidney stones 8 years ago) this pain is different.

## 2013-06-18 NOTE — ED Notes (Signed)
Pt c/o nausea - EDP notified, orders given.

## 2013-06-19 ENCOUNTER — Ambulatory Visit (HOSPITAL_BASED_OUTPATIENT_CLINIC_OR_DEPARTMENT_OTHER)
Admission: EM | Admit: 2013-06-19 | Discharge: 2013-06-19 | Disposition: A | Discharge status: Home / Self Care | Payer: Managed Care, Other (non HMO) | Source: Ambulatory Visit | Attending: Emergency Medicine | Admitting: Emergency Medicine

## 2013-06-19 ENCOUNTER — Ambulatory Visit (HOSPITAL_COMMUNITY)
Admission: EM | Admit: 2013-06-19 | Payer: Managed Care, Other (non HMO) | Source: Ambulatory Visit | Admitting: Emergency Medicine

## 2013-06-19 ENCOUNTER — Ambulatory Visit (HOSPITAL_COMMUNITY)
Admission: RE | Admit: 2013-06-19 | Discharge: 2013-06-19 | Disposition: A | Discharge status: Home / Self Care | Payer: Managed Care, Other (non HMO) | Source: Ambulatory Visit | Attending: Emergency Medicine | Admitting: Emergency Medicine

## 2013-06-19 DIAGNOSIS — R1011 Right upper quadrant pain: Secondary | ICD-10-CM | POA: Insufficient documentation

## 2013-06-19 MED ORDER — HYDROCODONE-ACETAMINOPHEN 5-325 MG PO TABS: 1.0000 | ORAL_TABLET | Freq: Four times a day (QID) | ORAL | Status: DC | PRN

## 2013-06-19 MED ORDER — ONDANSETRON 8 MG PO TBDP: 8.0000 mg | ORAL_TABLET | Freq: Three times a day (TID) | ORAL | Status: DC | PRN

## 2013-06-19 MED ORDER — PROMETHAZINE HCL 25 MG/ML IJ SOLN
25.0000 mg | Freq: Once | INTRAMUSCULAR | Status: AC
  Administered 2013-06-19: 25 mg via INTRAVENOUS
  Filled 2013-06-19: qty 1

## 2013-06-19 NOTE — ED Notes (Signed)
In room to discharge patient - and pt c/o nausea - wife and bedside, EDP notified.

## 2013-06-19 NOTE — ED Provider Notes (Addendum)
Nursing notes and vitals signs, including pulse oximetry, reviewed.  Summary of this visit's results, reviewed by myself:  Labs:  Results for orders placed during the hospital encounter of 06/18/13 (from the past 24 hour(s))  GLUCOSE, CAPILLARY     Status: None   Collection Time    06/18/13  9:42 PM      Result Value Range   Glucose-Capillary 74  70 - 99 mg/dL  URINALYSIS, ROUTINE W REFLEX MICROSCOPIC     Status: Abnormal   Collection Time    06/18/13  9:44 PM      Result Value Range   Color, Urine YELLOW  YELLOW   APPearance CLEAR  CLEAR   Specific Gravity, Urine 1.021  1.005 - 1.030   pH 5.0  5.0 - 8.0   Glucose, UA NEGATIVE  NEGATIVE mg/dL   Hgb urine dipstick TRACE (*) NEGATIVE   Bilirubin Urine NEGATIVE  NEGATIVE   Ketones, ur NEGATIVE  NEGATIVE mg/dL   Protein, ur NEGATIVE  NEGATIVE mg/dL   Urobilinogen, UA 0.2  0.0 - 1.0 mg/dL   Nitrite NEGATIVE  NEGATIVE   Leukocytes, UA NEGATIVE  NEGATIVE  CBC WITH DIFFERENTIAL     Status: Abnormal   Collection Time    06/18/13  9:44 PM      Result Value Range   WBC 13.7 (*) 4.0 - 10.5 K/uL   RBC 5.32  4.22 - 5.81 MIL/uL   Hemoglobin 16.6  13.0 - 17.0 g/dL   HCT 49.1  39.0 - 52.0 %   MCV 92.3  78.0 - 100.0 fL   MCH 31.2  26.0 - 34.0 pg   MCHC 33.8  30.0 - 36.0 g/dL   RDW 12.8  11.5 - 15.5 %   Platelets 311  150 - 400 K/uL   Neutrophils Relative % 78 (*) 43 - 77 %   Neutro Abs 10.7 (*) 1.7 - 7.7 K/uL   Lymphocytes Relative 15  12 - 46 %   Lymphs Abs 2.0  0.7 - 4.0 K/uL   Monocytes Relative 6  3 - 12 %   Monocytes Absolute 0.9  0.1 - 1.0 K/uL   Eosinophils Relative 1  0 - 5 %   Eosinophils Absolute 0.1  0.0 - 0.7 K/uL   Basophils Relative 0  0 - 1 %   Basophils Absolute 0.0  0.0 - 0.1 K/uL  COMPREHENSIVE METABOLIC PANEL     Status: Abnormal   Collection Time    06/18/13  9:44 PM      Result Value Range   Sodium 144  137 - 147 mEq/L   Potassium 3.6 (*) 3.7 - 5.3 mEq/L   Chloride 101  96 - 112 mEq/L   CO2 30  19 - 32  mEq/L   Glucose, Bld 99  70 - 99 mg/dL   BUN 22  6 - 23 mg/dL   Creatinine, Ser 1.00  0.50 - 1.35 mg/dL   Calcium 9.9  8.4 - 10.5 mg/dL   Total Protein 7.8  6.0 - 8.3 g/dL   Albumin 4.6  3.5 - 5.2 g/dL   AST 41 (*) 0 - 37 U/L   ALT 54 (*) 0 - 53 U/L   Alkaline Phosphatase 51  39 - 117 U/L   Total Bilirubin 0.6  0.3 - 1.2 mg/dL   GFR calc non Af Amer 80 (*) >90 mL/min   GFR calc Af Amer >90  >90 mL/min  LIPASE, BLOOD     Status: None  Collection Time    06/18/13  9:44 PM      Result Value Range   Lipase 20  11 - 59 U/L  URINE MICROSCOPIC-ADD ON     Status: Abnormal   Collection Time    06/18/13  9:44 PM      Result Value Range   Squamous Epithelial / LPF FEW (*) RARE   WBC, UA 0-2  <3 WBC/hpf   RBC / HPF 0-2  <3 RBC/hpf   Urine-Other MUCOUS PRESENT    GLUCOSE, CAPILLARY     Status: Abnormal   Collection Time    06/18/13 11:35 PM      Result Value Range   Glucose-Capillary 115 (*) 70 - 99 mg/dL    Imaging Studies: Ct Abdomen Pelvis W Contrast  06/19/2013   CLINICAL DATA:  Right upper quadrant abdominal pain.  EXAM: CT ABDOMEN AND PELVIS WITH CONTRAST  TECHNIQUE: Multidetector CT imaging of the abdomen and pelvis was performed using the standard protocol following bolus administration of intravenous contrast.  CONTRAST:  87mL OMNIPAQUE IOHEXOL 300 MG/ML SOLN, 130mL OMNIPAQUE IOHEXOL 300 MG/ML SOLN  COMPARISON:  CT scan of September 06, 2012.  FINDINGS: 11 x 6 mm right middle lobe density is noted. Left lung base is normal. No osseous abnormality is noted.  The liver, spleen and pancreas appear normal. No gallstones are noted. Adrenal glands appear normal. Left renal cyst is noted. No hydronephrosis or renal obstruction is noted. No renal or ureteral calculi are noted. The appendix appears normal. There is no evidence of bowel obstruction. No abnormal fluid collection is noted. Mild atherosclerotic calcifications abdominal aorta are noted without aneurysm formation. Urinary bladder  appears normal. No significant adenopathy is noted.  IMPRESSION: No significant abnormality is noted in the abdomen or pelvis.  11 x 6 mm density is noted in right middle lobe ; neoplasm cannot be excluded and further evaluation with PET scan is recommended.   Electronically Signed   By: Sabino Dick M.D.   On: 06/19/2013 00:19   12:38 AM The patient's CT scan is nondiagnostic for his abdominal pain. By history it is likely to be biliary colic. We will arrange for a gallbladder ultrasound later today.   Wynetta Fines, MD 06/19/13 0025  Wynetta Fines, MD 06/19/13 5409

## 2013-06-19 NOTE — Discharge Instructions (Signed)
Abdominal Pain, Adult °Many things can cause abdominal pain. Usually, abdominal pain is not caused by a disease and will improve without treatment. It can often be observed and treated at home. Your health care provider will do a physical exam and possibly order blood tests and X-rays to help determine the seriousness of your pain. However, in many cases, more time must pass before a clear cause of the pain can be found. Before that point, your health care provider may not know if you need more testing or further treatment. °HOME CARE INSTRUCTIONS  °Monitor your abdominal pain for any changes. The following actions may help to alleviate any discomfort you are experiencing: °· Only take over-the-counter or prescription medicines as directed by your health care provider. °· Do not take laxatives unless directed to do so by your health care provider. °· Try a clear liquid diet (broth, tea, or water) as directed by your health care provider. Slowly move to a bland diet as tolerated. °SEEK MEDICAL CARE IF: °· You have unexplained abdominal pain. °· You have abdominal pain associated with nausea or diarrhea. °· You have pain when you urinate or have a bowel movement. °· You experience abdominal pain that wakes you in the night. °· You have abdominal pain that is worsened or improved by eating food. °· You have abdominal pain that is worsened with eating fatty foods. °SEEK IMMEDIATE MEDICAL CARE IF:  °· Your pain does not go away within 2 hours. °· You have a fever. °· You keep throwing up (vomiting). °· Your pain is felt only in portions of the abdomen, such as the right side or the left lower portion of the abdomen. °· You pass bloody or black tarry stools. °MAKE SURE YOU: °· Understand these instructions.   °· Will watch your condition.   °· Will get help right away if you are not doing well or get worse.   °Document Released: 03/06/2005 Document Revised: 03/17/2013 Document Reviewed: 02/03/2013 °ExitCare® Patient  Information ©2014 [employer]. ° °

## 2013-06-19 NOTE — ED Notes (Signed)
EDP at bedside to address nausea noted at discharge - orders given.

## 2014-05-20 ENCOUNTER — Other Ambulatory Visit (HOSPITAL_COMMUNITY): Payer: Self-pay | Admitting: Family Medicine

## 2014-05-20 DIAGNOSIS — K6381 Dieulafoy lesion of intestine: Secondary | ICD-10-CM

## 2014-05-25 ENCOUNTER — Other Ambulatory Visit (HOSPITAL_COMMUNITY): Payer: Self-pay | Admitting: Family Medicine

## 2014-05-25 ENCOUNTER — Ambulatory Visit (HOSPITAL_COMMUNITY): Payer: Managed Care, Other (non HMO)

## 2014-05-25 ENCOUNTER — Other Ambulatory Visit (HOSPITAL_COMMUNITY): Payer: Managed Care, Other (non HMO)

## 2014-05-25 DIAGNOSIS — C801 Malignant (primary) neoplasm, unspecified: Secondary | ICD-10-CM

## 2014-05-31 ENCOUNTER — Other Ambulatory Visit (HOSPITAL_COMMUNITY): Payer: Managed Care, Other (non HMO)

## 2014-06-07 ENCOUNTER — Other Ambulatory Visit (HOSPITAL_COMMUNITY): Payer: Managed Care, Other (non HMO)

## 2014-06-07 ENCOUNTER — Other Ambulatory Visit: Payer: Self-pay | Admitting: Radiology

## 2014-06-08 ENCOUNTER — Ambulatory Visit (HOSPITAL_COMMUNITY)
Admission: RE | Admit: 2014-06-08 | Discharge: 2014-06-08 | Disposition: A | Discharge status: Home / Self Care | Payer: Managed Care, Other (non HMO) | Source: Ambulatory Visit | Attending: Family Medicine | Admitting: Family Medicine

## 2014-06-08 DIAGNOSIS — G8929 Other chronic pain: Secondary | ICD-10-CM | POA: Insufficient documentation

## 2014-06-08 DIAGNOSIS — Z7982 Long term (current) use of aspirin: Secondary | ICD-10-CM | POA: Diagnosis not present

## 2014-06-08 DIAGNOSIS — C801 Malignant (primary) neoplasm, unspecified: Secondary | ICD-10-CM

## 2014-06-08 DIAGNOSIS — Z87891 Personal history of nicotine dependence: Secondary | ICD-10-CM | POA: Insufficient documentation

## 2014-06-08 DIAGNOSIS — E119 Type 2 diabetes mellitus without complications: Secondary | ICD-10-CM | POA: Insufficient documentation

## 2014-06-08 DIAGNOSIS — M545 Low back pain: Secondary | ICD-10-CM | POA: Insufficient documentation

## 2014-06-08 DIAGNOSIS — Z794 Long term (current) use of insulin: Secondary | ICD-10-CM | POA: Diagnosis not present

## 2014-06-08 DIAGNOSIS — Z79899 Other long term (current) drug therapy: Secondary | ICD-10-CM | POA: Diagnosis not present

## 2014-06-08 LAB — CBC
HCT: 45.3 % (ref 39.0–52.0)
Hemoglobin: 15.3 g/dL (ref 13.0–17.0)
MCH: 31 pg (ref 26.0–34.0)
MCHC: 33.8 g/dL (ref 30.0–36.0)
MCV: 91.7 fL (ref 78.0–100.0)
Platelets: 279 10*3/uL (ref 150–400)
RBC: 4.94 MIL/uL (ref 4.22–5.81)
RDW: 12.8 % (ref 11.5–15.5)
WBC: 4 10*3/uL (ref 4.0–10.5)

## 2014-06-08 LAB — GLUCOSE, CAPILLARY: Glucose-Capillary: 142 mg/dL — ABNORMAL HIGH (ref 70–99)

## 2014-06-08 LAB — PROTIME-INR
INR: 1 (ref 0.00–1.49)
Prothrombin Time: 13.4 seconds (ref 11.6–15.2)

## 2014-06-08 MED ORDER — SODIUM CHLORIDE 0.9 % IV SOLN
INTRAVENOUS | Status: DC
Start: 2014-06-08 — End: 2014-06-09
  Administered 2014-06-08: 07:00:00 via INTRAVENOUS

## 2014-06-08 MED ORDER — FENTANYL CITRATE 0.05 MG/ML IJ SOLN
INTRAMUSCULAR | Status: AC
  Filled 2014-06-08: qty 4

## 2014-06-08 MED ORDER — MIDAZOLAM HCL 2 MG/2ML IJ SOLN
INTRAMUSCULAR | Status: AC
  Filled 2014-06-08: qty 6

## 2014-06-08 MED ORDER — FENTANYL CITRATE 0.05 MG/ML IJ SOLN
INTRAMUSCULAR | Status: AC | PRN
  Administered 2014-06-08: 50 ug via INTRAVENOUS
  Administered 2014-06-08: 25 ug via INTRAVENOUS

## 2014-06-08 MED ORDER — MIDAZOLAM HCL 2 MG/2ML IJ SOLN
INTRAMUSCULAR | Status: AC | PRN
  Administered 2014-06-08: 0.5 mg via INTRAVENOUS
  Administered 2014-06-08: 1 mg via INTRAVENOUS

## 2014-06-08 NOTE — Discharge Instructions (Signed)
Bone Biopsy, Needle, Care After  Read the instructions outlined below and refer to this sheet in the next few weeks. These discharge instructions provide you with general information on caring for yourself after you leave the hospital. Your caregiver may also give you specific instructions. While your treatment has been planned according to the most current medical practices available, unavoidable complications sometimes occur. If you have any problems or questions after discharge, call your caregiver.  Finding out the results of your test  Not all test results are available during your visit. If your test results are not back during the visit, make an appointment with your caregiver to find out the results. Do not assume everything is normal if you have not heard from your caregiver or the medical facility. It is important for you to follow up on all of your test results.   SEEK MEDICAL CARE IF:    You have redness, swelling, or increasing pain at the site of the biopsy.   You have pus coming from the biopsy site.   You have drainage from the biopsy site lasting longer than 1 day.   You notice a bad smell coming from the biopsy site or dressing.   You develop persistent nausea or vomiting.  SEEK IMMEDIATE MEDICAL CARE IF:   You have a fever.   You develop a rash.   You have difficulty breathing.   You develop any reaction or side effects to medicines given.  Document Released: 12/14/2004 Document Revised: 03/17/2013 Document Reviewed: 11/01/2008  ExitCare Patient Information 2015 [employer]. This information is not intended to replace advice given to you by your health care provider. Make sure you discuss any questions you have with your health care provider.        Conscious Sedation  Sedation is the use of medicines to promote relaxation and relieve discomfort and anxiety. Conscious sedation is a type of sedation. Under conscious sedation you are less alert than normal but are still able to respond  to instructions or stimulation. Conscious sedation is used during short medical and dental procedures. It is milder than deep sedation or general anesthesia and allows you to return to your regular activities sooner.   LET YOUR HEALTH CARE PROVIDER KNOW ABOUT:    Any allergies you have.   All medicines you are taking, including vitamins, herbs, eye drops, creams, and over-the-counter medicines.   Use of steroids (by mouth or creams).   Previous problems you or members of your family have had with the use of anesthetics.   Any blood disorders you have.   Previous surgeries you have had.   Medical conditions you have.   Possibility of pregnancy, if this applies.   Use of cigarettes, alcohol, or illegal drugs.  RISKS AND COMPLICATIONS  Generally, this is a safe procedure. However, as with any procedure, problems can occur. Possible problems include:   Oversedation.   Trouble breathing on your own. You may need to have a breathing tube until you are awake and breathing on your own.   Allergic reaction to any of the medicines used for the procedure.  BEFORE THE PROCEDURE   You may have blood tests done. These tests can help show how well your kidneys and liver are working. They can also show how well your blood clots.   A physical exam will be done.   Only take medicines as directed by your health care provider. You may need to stop taking medicines (such as blood thinners, aspirin,   or nonsteroidal anti-inflammatory drugs) before the procedure.    Do not eat or drink at least 6 hours before the procedure or as directed by your health care provider.   Arrange for a responsible adult, family member, or friend to take you home after the procedure. He or she should stay with you for at least 24 hours after the procedure, until the medicine has worn off.  PROCEDURE    An intravenous (IV) catheter will be inserted into one of your veins. Medicine will be able to flow directly into your body through this  catheter. You may be given medicine through this tube to help prevent pain and help you relax.   The medical or dental procedure will be done.  AFTER THE PROCEDURE   You will stay in a recovery area until the medicine has worn off. Your blood pressure and pulse will be checked.    Depending on the procedure you had, you may be allowed to go home when you can tolerate liquids and your pain is under control.  Document Released: 02/19/2001 Document Revised: 06/01/2013 Document Reviewed: 02/01/2013  ExitCare Patient Information 2015 [employer]. This information is not intended to replace advice given to you by your health care provider. Make sure you discuss any questions you have with your health care provider.

## 2014-06-08 NOTE — H&P (Signed)
Chief Complaint: "I'm having a bone biopsy"  Referring Physician(s): Hawks,Aldene N  History of Present Illness: Christopher Hayden is a 58 y.o. male with history of acute on chronic low back pain with intermittent radiation to LLE and prior imaging revealing hypermetabolic  L2 vertebral body /medial left iliac bone lesions. He has no hx of cancer. He presents today for CT guided left iliac bone biopsy.  Past Medical History  Diagnosis Date  . Diabetes mellitus without complication   . Kidney stone     No past surgical history on file.  Allergies: Metformin and related; Meloxicam; Naproxen; and Zetia  Medications: Prior to Admission medications   Medication Sig Start Date End Date Taking? Authorizing Provider  aspirin (ASPIRIN EC) 81 MG EC tablet Take 81 mg by mouth at bedtime.    Yes Historical Provider, MD  insulin aspart (NOVOLOG) 100 UNIT/ML injection Inject 5-24 Units into the skin 3 (three) times daily with meals. On Sliding Scale   Yes Historical Provider, MD  insulin glargine (LANTUS) 100 UNIT/ML injection Inject 10-26 Units into the skin 2 (two) times daily. Takes 10 units with breakfast and 26 units at bedtime   Yes Historical Provider, MD  pravastatin (PRAVACHOL) 80 MG tablet Take 80 mg by mouth at bedtime.   Yes Historical Provider, MD  tetrahydrozoline (VISINE) 0.05 % ophthalmic solution Place 2 drops into both eyes daily as needed (for alleriges and dry eyes).   Yes Historical Provider, MD  HYDROcodone-acetaminophen (NORCO/VICODIN) 5-325 MG per tablet Take 1-2 tablets by mouth every 6 (six) hours as needed for moderate pain. Patient not taking: Reported on 06/08/2014 06/19/13   Karen Chafe [name], MD  ondansetron (ZOFRAN ODT) 8 MG disintegrating tablet Take 1 tablet (8 mg total) by mouth every 8 (eight) hours as needed. Patient not taking: Reported on 06/08/2014 06/19/13   Wynetta Fines, MD    No family history on file.  History   Social History  . Marital Status:  Married    Spouse Name: N/A    Number of Children: N/A  . Years of Education: N/A   Social History Main Topics  . Smoking status: Former Research scientist (life sciences)  . Smokeless tobacco: Never Used  . Alcohol Use: No  . Drug Use: No  . Sexual Activity: Not on file   Other Topics Concern  . Not on file   Social History Narrative  . No narrative on file      Review of Systems  Constitutional: Negative for fever and chills.  Respiratory: Negative for cough and shortness of breath.   Cardiovascular: Negative for chest pain.  Gastrointestinal: Negative for nausea, vomiting, abdominal pain and blood in stool.  Genitourinary: Negative for dysuria and hematuria.  Musculoskeletal: Positive for back pain.  Neurological: Positive for headaches.  Hematological: Does not bruise/bleed easily.    Vital Signs: BP 125/80 mmHg  Pulse 71  Temp(Src) 97.8 F (36.6 C) (Oral)  Resp 18  Ht 5\' 10"  (1.778 m)  Wt 192 lb (87.091 kg)  BMI 27.55 kg/m2  SpO2 99%  Physical Exam  Constitutional: He is oriented to person, place, and time. He appears well-developed and well-nourished.  Cardiovascular: Normal rate and regular rhythm.   Pulmonary/Chest: Effort normal and breath sounds normal.  Abdominal: Soft. Bowel sounds are normal.  Musculoskeletal: Normal range of motion. He exhibits no edema.  Neurological: He is alert and oriented to person, place, and time.    Imaging: No results found.  Labs:  CBC:  Recent Labs  06/18/13 2144 06/08/14 0723  WBC 13.7* 4.0  HGB 16.6 15.3  HCT 49.1 45.3  PLT 311 279    COAGS:  Recent Labs  06/08/14 0723  INR 1.00    BMP:  Recent Labs  06/18/13 2144  NA 144  K 3.6*  CL 101  CO2 30  GLUCOSE 99  BUN 22  CALCIUM 9.9  CREATININE 1.00  GFRNONAA 80*  GFRAA >90    LIVER FUNCTION TESTS:  Recent Labs  06/18/13 2144  BILITOT 0.6  AST 41*  ALT 54*  ALKPHOS 51  PROT 7.8  ALBUMIN 4.6    TUMOR MARKERS: No results for input(s): AFPTM, CEA,  CA199, CHROMGRNA in the last 8760 hours.  Assessment and Plan: Christopher Hayden is a 58 y.o. male with history of acute on chronic low back pain with intermittent radiation to LLE and prior imaging revealing hypermetabolic  L2 vertebral body /medial left iliac bone lesions. He has no hx of cancer. He presents today for CT guided left iliac bone biopsy. Details/risks of procedure d/w pt/son with their understanding and consent.      Signed: Autumn Messing 06/08/2014, 8:27 AM

## 2014-06-08 NOTE — Procedures (Signed)
Interventional Radiology Procedure Note  Procedure: CT guided core biopsy of sclerotic left iliac bone lesion, concerning for metastatic disease Complications: None Recommendations: - bedrest 1 hour - Follow biopsy results  Signed,  Dulcy Fanny. Earleen Newport, DO

## 2015-07-18 DIAGNOSIS — D869 Sarcoidosis, unspecified: Secondary | ICD-10-CM | POA: Diagnosis not present

## 2015-07-24 DIAGNOSIS — E785 Hyperlipidemia, unspecified: Secondary | ICD-10-CM | POA: Diagnosis not present

## 2015-07-24 DIAGNOSIS — D360 Benign neoplasm of lymph nodes: Secondary | ICD-10-CM | POA: Diagnosis not present

## 2015-07-24 DIAGNOSIS — I889 Nonspecific lymphadenitis, unspecified: Secondary | ICD-10-CM | POA: Diagnosis not present

## 2015-07-24 DIAGNOSIS — Z79899 Other long term (current) drug therapy: Secondary | ICD-10-CM | POA: Diagnosis not present

## 2015-07-24 DIAGNOSIS — E119 Type 2 diabetes mellitus without complications: Secondary | ICD-10-CM | POA: Diagnosis not present

## 2015-07-24 DIAGNOSIS — Z7982 Long term (current) use of aspirin: Secondary | ICD-10-CM | POA: Diagnosis not present

## 2015-07-24 DIAGNOSIS — Z794 Long term (current) use of insulin: Secondary | ICD-10-CM | POA: Diagnosis not present

## 2015-07-31 DIAGNOSIS — R7611 Nonspecific reaction to tuberculin skin test without active tuberculosis: Secondary | ICD-10-CM | POA: Diagnosis not present

## 2015-07-31 DIAGNOSIS — D869 Sarcoidosis, unspecified: Secondary | ICD-10-CM | POA: Diagnosis not present

## 2015-08-01 DIAGNOSIS — Z794 Long term (current) use of insulin: Secondary | ICD-10-CM | POA: Diagnosis not present

## 2015-08-01 DIAGNOSIS — E11649 Type 2 diabetes mellitus with hypoglycemia without coma: Secondary | ICD-10-CM | POA: Diagnosis not present

## 2015-08-01 DIAGNOSIS — E119 Type 2 diabetes mellitus without complications: Secondary | ICD-10-CM | POA: Diagnosis not present

## 2015-08-01 DIAGNOSIS — E782 Mixed hyperlipidemia: Secondary | ICD-10-CM | POA: Diagnosis not present

## 2015-09-12 DIAGNOSIS — Z794 Long term (current) use of insulin: Secondary | ICD-10-CM | POA: Diagnosis not present

## 2015-09-12 DIAGNOSIS — E782 Mixed hyperlipidemia: Secondary | ICD-10-CM | POA: Diagnosis not present

## 2015-09-12 DIAGNOSIS — E1165 Type 2 diabetes mellitus with hyperglycemia: Secondary | ICD-10-CM | POA: Diagnosis not present

## 2015-10-16 DIAGNOSIS — M47812 Spondylosis without myelopathy or radiculopathy, cervical region: Secondary | ICD-10-CM | POA: Diagnosis not present

## 2015-10-16 DIAGNOSIS — D8689 Sarcoidosis of other sites: Secondary | ICD-10-CM | POA: Diagnosis not present

## 2015-10-16 DIAGNOSIS — E782 Mixed hyperlipidemia: Secondary | ICD-10-CM | POA: Diagnosis not present

## 2015-10-16 DIAGNOSIS — E119 Type 2 diabetes mellitus without complications: Secondary | ICD-10-CM | POA: Diagnosis not present

## 2015-10-16 DIAGNOSIS — Z794 Long term (current) use of insulin: Secondary | ICD-10-CM | POA: Diagnosis not present

## 2015-10-30 DIAGNOSIS — M47816 Spondylosis without myelopathy or radiculopathy, lumbar region: Secondary | ICD-10-CM | POA: Diagnosis not present

## 2015-10-30 DIAGNOSIS — D869 Sarcoidosis, unspecified: Secondary | ICD-10-CM | POA: Diagnosis not present

## 2015-10-30 DIAGNOSIS — R911 Solitary pulmonary nodule: Secondary | ICD-10-CM | POA: Diagnosis not present

## 2015-10-30 DIAGNOSIS — Z794 Long term (current) use of insulin: Secondary | ICD-10-CM | POA: Diagnosis not present

## 2015-10-30 DIAGNOSIS — I700 Atherosclerosis of aorta: Secondary | ICD-10-CM | POA: Diagnosis not present

## 2015-10-30 DIAGNOSIS — E119 Type 2 diabetes mellitus without complications: Secondary | ICD-10-CM | POA: Diagnosis not present

## 2015-12-18 DIAGNOSIS — R51 Headache: Secondary | ICD-10-CM | POA: Diagnosis not present

## 2015-12-27 DIAGNOSIS — Z794 Long term (current) use of insulin: Secondary | ICD-10-CM | POA: Diagnosis not present

## 2015-12-27 DIAGNOSIS — E782 Mixed hyperlipidemia: Secondary | ICD-10-CM | POA: Diagnosis not present

## 2015-12-27 DIAGNOSIS — E11649 Type 2 diabetes mellitus with hypoglycemia without coma: Secondary | ICD-10-CM | POA: Diagnosis not present

## 2015-12-27 DIAGNOSIS — E1165 Type 2 diabetes mellitus with hyperglycemia: Secondary | ICD-10-CM | POA: Diagnosis not present

## 2015-12-29 DIAGNOSIS — M25521 Pain in right elbow: Secondary | ICD-10-CM | POA: Diagnosis not present

## 2016-01-24 DIAGNOSIS — E119 Type 2 diabetes mellitus without complications: Secondary | ICD-10-CM | POA: Diagnosis not present

## 2016-01-24 DIAGNOSIS — Z794 Long term (current) use of insulin: Secondary | ICD-10-CM | POA: Diagnosis not present

## 2016-01-24 DIAGNOSIS — D869 Sarcoidosis, unspecified: Secondary | ICD-10-CM | POA: Diagnosis not present

## 2016-03-14 DIAGNOSIS — M25841 Other specified joint disorders, right hand: Secondary | ICD-10-CM | POA: Diagnosis not present

## 2016-03-14 DIAGNOSIS — M79641 Pain in right hand: Secondary | ICD-10-CM | POA: Diagnosis not present

## 2016-04-04 DIAGNOSIS — E1165 Type 2 diabetes mellitus with hyperglycemia: Secondary | ICD-10-CM | POA: Diagnosis not present

## 2016-04-04 DIAGNOSIS — Z794 Long term (current) use of insulin: Secondary | ICD-10-CM | POA: Diagnosis not present

## 2016-04-04 DIAGNOSIS — E782 Mixed hyperlipidemia: Secondary | ICD-10-CM | POA: Diagnosis not present

## 2016-06-20 ENCOUNTER — Emergency Department (HOSPITAL_BASED_OUTPATIENT_CLINIC_OR_DEPARTMENT_OTHER)
Admission: EM | Admit: 2016-06-20 | Discharge: 2016-06-20 | Disposition: A | Discharge status: Home / Self Care | Payer: Managed Care, Other (non HMO) | Attending: Emergency Medicine | Admitting: Emergency Medicine

## 2016-06-20 ENCOUNTER — Emergency Department (HOSPITAL_BASED_OUTPATIENT_CLINIC_OR_DEPARTMENT_OTHER): Payer: Managed Care, Other (non HMO)

## 2016-06-20 ENCOUNTER — Encounter (HOSPITAL_BASED_OUTPATIENT_CLINIC_OR_DEPARTMENT_OTHER): Payer: Self-pay | Admitting: *Deleted

## 2016-06-20 DIAGNOSIS — Z79899 Other long term (current) drug therapy: Secondary | ICD-10-CM | POA: Diagnosis not present

## 2016-06-20 DIAGNOSIS — J0100 Acute maxillary sinusitis, unspecified: Secondary | ICD-10-CM | POA: Diagnosis not present

## 2016-06-20 DIAGNOSIS — R51 Headache: Secondary | ICD-10-CM | POA: Diagnosis present

## 2016-06-20 DIAGNOSIS — Z794 Long term (current) use of insulin: Secondary | ICD-10-CM | POA: Insufficient documentation

## 2016-06-20 DIAGNOSIS — J329 Chronic sinusitis, unspecified: Secondary | ICD-10-CM

## 2016-06-20 DIAGNOSIS — E119 Type 2 diabetes mellitus without complications: Secondary | ICD-10-CM | POA: Insufficient documentation

## 2016-06-20 DIAGNOSIS — J3489 Other specified disorders of nose and nasal sinuses: Secondary | ICD-10-CM | POA: Diagnosis not present

## 2016-06-20 DIAGNOSIS — R0602 Shortness of breath: Secondary | ICD-10-CM | POA: Diagnosis not present

## 2016-06-20 DIAGNOSIS — Z7982 Long term (current) use of aspirin: Secondary | ICD-10-CM | POA: Diagnosis not present

## 2016-06-20 DIAGNOSIS — Z87891 Personal history of nicotine dependence: Secondary | ICD-10-CM | POA: Insufficient documentation

## 2016-06-20 LAB — CBC WITH DIFFERENTIAL/PLATELET
Basophils Absolute: 0 10*3/uL (ref 0.0–0.1)
Basophils Relative: 0 %
EOS PCT: 2 %
Eosinophils Absolute: 0.2 10*3/uL (ref 0.0–0.7)
HCT: 48.6 % (ref 39.0–52.0)
Hemoglobin: 16.2 g/dL (ref 13.0–17.0)
LYMPHS ABS: 1.4 10*3/uL (ref 0.7–4.0)
Lymphocytes Relative: 14 %
MCH: 30.2 pg (ref 26.0–34.0)
MCHC: 33.3 g/dL (ref 30.0–36.0)
MCV: 90.7 fL (ref 78.0–100.0)
MONO ABS: 1.1 10*3/uL — AB (ref 0.1–1.0)
Monocytes Relative: 11 %
Neutro Abs: 6.8 10*3/uL (ref 1.7–7.7)
Neutrophils Relative %: 73 %
PLATELETS: 250 10*3/uL (ref 150–400)
RBC: 5.36 MIL/uL (ref 4.22–5.81)
RDW: 13.3 % (ref 11.5–15.5)
WBC: 9.4 10*3/uL (ref 4.0–10.5)

## 2016-06-20 LAB — BASIC METABOLIC PANEL
Anion gap: 9 (ref 5–15)
BUN: 17 mg/dL (ref 6–20)
CHLORIDE: 102 mmol/L (ref 101–111)
CO2: 26 mmol/L (ref 22–32)
Calcium: 9.1 mg/dL (ref 8.9–10.3)
Creatinine, Ser: 0.82 mg/dL (ref 0.61–1.24)
GFR calc Af Amer: >60 mL/min (ref >60)
GLUCOSE: 136 mg/dL — AB (ref 65–99)
Potassium: 3.5 mmol/L (ref 3.5–5.1)
Sodium: 137 mmol/L (ref 135–145)

## 2016-06-20 MED ORDER — FENTANYL CITRATE (PF) 100 MCG/2ML IJ SOLN
50.0000 ug | Freq: Once | INTRAMUSCULAR | Status: AC
  Administered 2016-06-20: 50 ug via INTRAVENOUS
  Filled 2016-06-20: qty 2

## 2016-06-20 MED ORDER — SODIUM CHLORIDE 0.9 % IV BOLUS (SEPSIS)
1000.0000 mL | Freq: Once | INTRAVENOUS | Status: AC
  Administered 2016-06-20: 1000 mL via INTRAVENOUS

## 2016-06-20 MED ORDER — OXYCODONE-ACETAMINOPHEN 5-325 MG PO TABS: 1.0000 | ORAL_TABLET | ORAL | 0 refills | Status: DC | PRN

## 2016-06-20 MED ORDER — ONDANSETRON HCL 4 MG/2ML IJ SOLN
4.0000 mg | Freq: Once | INTRAMUSCULAR | Status: AC
  Administered 2016-06-20: 4 mg via INTRAVENOUS
  Filled 2016-06-20: qty 2

## 2016-06-20 MED ORDER — AMOXICILLIN 500 MG PO CAPS
500.0000 mg | ORAL_CAPSULE | Freq: Once | ORAL | Status: AC
  Administered 2016-06-20: 500 mg via ORAL
  Filled 2016-06-20: qty 1

## 2016-06-20 MED ORDER — OXYMETAZOLINE HCL 0.05 % NA SOLN
1.0000 | Freq: Once | NASAL | Status: AC
  Administered 2016-06-20: 1 via NASAL
  Filled 2016-06-20: qty 15

## 2016-06-20 MED ORDER — AMOXICILLIN 500 MG PO CAPS: 500.0000 mg | ORAL_CAPSULE | Freq: Three times a day (TID) | ORAL | 0 refills | Status: DC

## 2016-06-20 NOTE — ED Provider Notes (Signed)
[facility] DEPT [facility] Provider Note   CSN: [id number] Arrival date & time: 06/20/16  1647  By signing my name below, I, Gwenlyn Fudge, attest that this documentation has been prepared under the direction and in the presence of [name], MD. Electronically Signed: Gwenlyn Fudge, ED Scribe. 06/20/16. 8:00 PM.  History   Chief Complaint Chief Complaint  Patient presents with  . Facial Pain   The history is provided by the patient. No language interpreter was used.   HPI Comments: TILMON BOSARGE is a 61 y.o. male with PMHx of DM who presents to the Emergency Department complaining of sudden onset, constant, moderate sinus region facial pain onset 3:30 AM. He states he woke up this morning at 3:30 due to pain. Pt had rhinorrhea yesterday and dental and jaw pain this morning. He was seen this morning by PCP and received a low dose pain injection which provided mild relief. Pt was sent to receive a CT scan of his face. Pt does not smoke. He denies allergies to antibiotics. Pt is allergic to Metformin and Meloxicam causes abdominal cramping. Gabapentin causes too many side effects. Hydrocodone prevents him from sleeping at night.  Past Medical History:  Diagnosis Date  . Diabetes mellitus without complication ([place])   . Kidney stone     There are no active problems to display for this patient.   Past Surgical History:  Procedure Laterality Date  . KNEE SURGERY         Home Medications    Prior to Admission medications   Medication Sig Start Date End Date Taking? Authorizing Provider  aspirin (ASPIRIN EC) 81 MG EC tablet Take 81 mg by mouth at bedtime.    Yes Historical Provider, MD  insulin aspart (NOVOLOG) 100 UNIT/ML injection Inject 5-24 Units into the skin 3 (three) times daily with meals. On Sliding Scale   Yes Historical Provider, MD  insulin glargine (LANTUS) 100 UNIT/ML injection Inject 10-26 Units into the skin 2 (two) times daily. Takes 10 units with breakfast and 26  units at bedtime   Yes Historical Provider, MD  pravastatin (PRAVACHOL) 80 MG tablet Take 80 mg by mouth at bedtime.   Yes Historical Provider, MD  amoxicillin (AMOXIL) 500 MG capsule Take 1 capsule (500 mg total) by mouth 3 (three) times daily. 06/20/16   [name], MD  HYDROcodone-acetaminophen (NORCO/VICODIN) 5-325 MG per tablet Take 1-2 tablets by mouth every 6 (six) hours as needed for moderate pain. Patient not taking: Reported on 06/08/2014 06/19/13   Shanon Rosser, MD  ondansetron (ZOFRAN ODT) 8 MG disintegrating tablet Take 1 tablet (8 mg total) by mouth every 8 (eight) hours as needed. Patient not taking: Reported on 06/08/2014 06/19/13   Shanon Rosser, MD  oxyCODONE-acetaminophen (PERCOCET/ROXICET) 5-325 MG tablet Take 1 tablet by mouth every 4 (four) hours as needed for severe pain. 06/20/16   [name], MD  tetrahydrozoline (VISINE) 0.05 % ophthalmic solution Place 2 drops into both eyes daily as needed (for alleriges and dry eyes).    Historical Provider, MD    Family History No family history on file.  Social History Social History  Substance Use Topics  . Smoking status: Former Research scientist (life sciences)  . Smokeless tobacco: Never Used  . Alcohol use No     Allergies   Metformin and related; Meloxicam; Naproxen; and Zetia [ezetimibe]   Review of Systems Review of Systems  Constitutional: Negative for fever.  HENT: Positive for congestion, rhinorrhea, sinus pain and sinus pressure.  Facial Pain  All other systems reviewed and are negative.  Physical Exam Updated Vital Signs BP 137/83 (BP Location: Left Arm)   Pulse 76   Temp 98.6 F (37 C) (Oral)   Resp 20   Ht 5\' 11"  (1.803 m)   Wt 192 lb (87.1 kg)   SpO2 98%   BMI 26.78 kg/m   Physical Exam  Constitutional: He is oriented to person, place, and time. He appears well-developed and well-nourished.  HENT:  Head: Normocephalic and atraumatic.  Right Ear: External ear normal.  Left Ear: External ear normal.  Nose:  Nose normal.  Mouth/Throat: Oropharynx is clear and moist.  Tenderness over maxillary sinuses  Eyes: Conjunctivae and EOM are normal. Pupils are equal, round, and reactive to light.  Neck: Normal range of motion. Neck supple.  Cardiovascular: Normal rate, regular rhythm, normal heart sounds and intact distal pulses.   Pulmonary/Chest: Effort normal and breath sounds normal.  Abdominal: Soft. Bowel sounds are normal.  Musculoskeletal: Normal range of motion.  Neurological: He is alert and oriented to person, place, and time.  Skin: Skin is warm.  Psychiatric: He has a normal mood and affect. His behavior is normal. Judgment and thought content normal.  Nursing note and vitals reviewed.  ED Treatments / Results  DIAGNOSTIC STUDIES: Oxygen Saturation is 99% on RA, normal by my interpretation.    COORDINATION OF CARE: 6:11 PM Discussed treatment plan with pt at bedside which includes CT Maxillofacial and pt agreed to plan.  Labs (all labs ordered are listed, but only abnormal results are displayed) Labs Reviewed  BASIC METABOLIC PANEL - Abnormal; Notable for the following:       Result Value   Glucose, Bld 136 (*)    All other components within normal limits  CBC WITH DIFFERENTIAL/PLATELET - Abnormal; Notable for the following:    Monocytes Absolute 1.1 (*)    All other components within normal limits    EKG  EKG Interpretation None       Radiology Dg Chest 2 View  Result Date: 06/20/2016 CLINICAL DATA:  Sudden onset of shortness of breath today. EXAM: CHEST  2 VIEW COMPARISON:  04/25/2016 FINDINGS: The cardiac silhouette, mediastinal and hilar contours are within normal limits. The lungs are clear. No pleural effusion. The bony thorax is intact. IMPRESSION: No acute cardiopulmonary findings. Electronically Signed   By: Marijo Sanes M.D.   On: 06/20/2016 19:30   Ct Maxillofacial Wo Contrast  Result Date: 06/20/2016 CLINICAL DATA:  Sinus pain. EXAM: CT MAXILLOFACIAL WITHOUT  CONTRAST TECHNIQUE: Multidetector CT imaging of the maxillofacial structures was performed. Multiplanar CT image reconstructions were also generated. A small metallic BB was placed on the right temple in order to reliably differentiate right from left. COMPARISON:  Head CT 07/16/2014 FINDINGS: Osseous: No acute bony findings. Orbits: No orbital lesions.  The globes are intact Sinuses: The frontal sinuses are clear. There is moderate scattered mucoperiosteal thickening involving the ethmoid air cells. Minimal mucoperiosteal thickening involving both halves of the sphenoid sinus. There is fluid in the right maxillary sinus suggesting acute sinusitis. There is moderate mucoperiosteal thickening involving both maxillary sinuses. The mastoid air cells and middle ear cavities are clear. Soft tissues: No significant soft tissue abnormalities. Limited intracranial: Grossly normal. IMPRESSION: 1. Moderate mucoperiosteal thickening involving the paranasal sinuses as well as an air-fluid level in the right maxillary sinus consistent with acute sinusitis. 2. The frontal sinuses are clear and the mastoid air cells and middle ear cavities are clear.  Electronically Signed   By: Marijo Sanes M.D.   On: 06/20/2016 19:08    Procedures Procedures (including critical care time)  Medications Ordered in ED Medications  fentaNYL (SUBLIMAZE) injection 50 mcg (not administered)  amoxicillin (AMOXIL) capsule 500 mg (not administered)  sodium chloride 0.9 % bolus 1,000 mL (1,000 mLs Intravenous New Bag/Given 06/20/16 1830)  ondansetron (ZOFRAN) injection 4 mg (4 mg Intravenous Given 06/20/16 1826)  fentaNYL (SUBLIMAZE) injection 50 mcg (50 mcg Intravenous Given 06/20/16 1830)  oxymetazoline (AFRIN) 0.05 % nasal spray 1 spray (1 spray Each Nare Given 06/20/16 1822)     Initial Impression / Assessment and Plan / ED Course  I have reviewed the triage vital signs and the nursing notes.  Pertinent labs & imaging results that were  available during my care of the patient were reviewed by me and considered in my medical decision making (see chart for details).  Clinical Course     Pt is feeling much better.  He was given afrin in the ED to take here and at home (BID for 3 days).  He is given a rx for amox and oxy (#10).  He knows to return if worse and to f/u with pcp.  Final Clinical Impressions(s) / ED Diagnoses   Final diagnoses:  Acute non-recurrent maxillary sinusitis    New Prescriptions New Prescriptions   AMOXICILLIN (AMOXIL) 500 MG CAPSULE    Take 1 capsule (500 mg total) by mouth 3 (three) times daily.   OXYCODONE-ACETAMINOPHEN (PERCOCET/ROXICET) 5-325 MG TABLET    Take 1 tablet by mouth every 4 (four) hours as needed for severe pain.   I personally performed the services described in this documentation, which was scribed in my presence. The recorded information has been reviewed and is accurate.   [name], MD 06/20/16 2002

## 2016-06-20 NOTE — ED Notes (Signed)
Pt. Here for sinus pain in his face since 3am.  No facial changes.  Pt. Has had sinus pain and upper teeth pain today.  Pt. Reports he was to have a CT scan at [facility] and the machine broke down and was sent to the ED.  Now the Pt. Says if prior approval is not done then they dont want it done.  Wannetta Sender is in speaking with the Pt. About the CT.

## 2016-06-20 NOTE — ED Triage Notes (Signed)
Facial pain. He was seen in his doctors office for possible sinus infection. He was sent here CT of his sinus.

## 2016-06-21 ENCOUNTER — Other Ambulatory Visit (HOSPITAL_BASED_OUTPATIENT_CLINIC_OR_DEPARTMENT_OTHER): Payer: Self-pay | Admitting: Family Medicine

## 2016-06-21 DIAGNOSIS — G4453 Primary thunderclap headache: Secondary | ICD-10-CM

## 2016-07-05 DIAGNOSIS — E782 Mixed hyperlipidemia: Secondary | ICD-10-CM | POA: Diagnosis not present

## 2016-07-05 DIAGNOSIS — Z794 Long term (current) use of insulin: Secondary | ICD-10-CM | POA: Diagnosis not present

## 2016-07-05 DIAGNOSIS — E1165 Type 2 diabetes mellitus with hyperglycemia: Secondary | ICD-10-CM | POA: Diagnosis not present

## 2016-07-25 DIAGNOSIS — M5136 Other intervertebral disc degeneration, lumbar region: Secondary | ICD-10-CM | POA: Diagnosis not present

## 2016-07-25 DIAGNOSIS — M48061 Spinal stenosis, lumbar region without neurogenic claudication: Secondary | ICD-10-CM | POA: Diagnosis not present

## 2016-07-25 DIAGNOSIS — M5116 Intervertebral disc disorders with radiculopathy, lumbar region: Secondary | ICD-10-CM | POA: Diagnosis not present

## 2016-07-25 DIAGNOSIS — M4726 Other spondylosis with radiculopathy, lumbar region: Secondary | ICD-10-CM | POA: Diagnosis not present

## 2017-01-16 ENCOUNTER — Emergency Department (HOSPITAL_BASED_OUTPATIENT_CLINIC_OR_DEPARTMENT_OTHER)
Admission: EM | Admit: 2017-01-16 | Discharge: 2017-01-16 | Disposition: A | Discharge status: Home / Self Care | Payer: Managed Care, Other (non HMO) | Attending: Emergency Medicine | Admitting: Emergency Medicine

## 2017-01-16 ENCOUNTER — Emergency Department (HOSPITAL_BASED_OUTPATIENT_CLINIC_OR_DEPARTMENT_OTHER): Payer: Managed Care, Other (non HMO)

## 2017-01-16 ENCOUNTER — Encounter (HOSPITAL_BASED_OUTPATIENT_CLINIC_OR_DEPARTMENT_OTHER): Payer: Self-pay | Admitting: *Deleted

## 2017-01-16 DIAGNOSIS — Z87891 Personal history of nicotine dependence: Secondary | ICD-10-CM | POA: Insufficient documentation

## 2017-01-16 DIAGNOSIS — Z7982 Long term (current) use of aspirin: Secondary | ICD-10-CM | POA: Insufficient documentation

## 2017-01-16 DIAGNOSIS — Z794 Long term (current) use of insulin: Secondary | ICD-10-CM | POA: Insufficient documentation

## 2017-01-16 DIAGNOSIS — Z7902 Long term (current) use of antithrombotics/antiplatelets: Secondary | ICD-10-CM | POA: Insufficient documentation

## 2017-01-16 DIAGNOSIS — R1031 Right lower quadrant pain: Secondary | ICD-10-CM | POA: Diagnosis not present

## 2017-01-16 DIAGNOSIS — E119 Type 2 diabetes mellitus without complications: Secondary | ICD-10-CM | POA: Insufficient documentation

## 2017-01-16 DIAGNOSIS — Z79899 Other long term (current) drug therapy: Secondary | ICD-10-CM | POA: Insufficient documentation

## 2017-01-16 DIAGNOSIS — R109 Unspecified abdominal pain: Secondary | ICD-10-CM

## 2017-01-16 LAB — CBC WITH DIFFERENTIAL/PLATELET
BASOS ABS: 0 10*3/uL (ref 0.0–0.1)
BASOS PCT: 0 %
EOS ABS: 0.1 10*3/uL (ref 0.0–0.7)
EOS PCT: 2 %
HCT: 40.3 % (ref 39.0–52.0)
Hemoglobin: 13.8 g/dL (ref 13.0–17.0)
LYMPHS ABS: 1.2 10*3/uL (ref 0.7–4.0)
Lymphocytes Relative: 18 %
MCH: 31 pg (ref 26.0–34.0)
MCHC: 34.2 g/dL (ref 30.0–36.0)
MCV: 90.6 fL (ref 78.0–100.0)
Monocytes Absolute: 0.5 10*3/uL (ref 0.1–1.0)
Monocytes Relative: 8 %
NEUTROS PCT: 72 %
Neutro Abs: 4.8 10*3/uL (ref 1.7–7.7)
PLATELETS: 252 10*3/uL (ref 150–400)
RBC: 4.45 MIL/uL (ref 4.22–5.81)
RDW: 13.2 % (ref 11.5–15.5)
WBC: 6.6 10*3/uL (ref 4.0–10.5)

## 2017-01-16 LAB — URINALYSIS, MICROSCOPIC (REFLEX)

## 2017-01-16 LAB — URINALYSIS, ROUTINE W REFLEX MICROSCOPIC
BILIRUBIN URINE: NEGATIVE
Glucose, UA: NEGATIVE mg/dL
Ketones, ur: 15 mg/dL — AB
LEUKOCYTES UA: NEGATIVE
NITRITE: NEGATIVE
Protein, ur: NEGATIVE mg/dL
Specific Gravity, Urine: 1.027 (ref 1.005–1.030)
pH: 5 (ref 5.0–8.0)

## 2017-01-16 LAB — BASIC METABOLIC PANEL
Anion gap: 8 (ref 5–15)
BUN: 26 mg/dL — AB (ref 6–20)
CALCIUM: 8.7 mg/dL — AB (ref 8.9–10.3)
CO2: 24 mmol/L (ref 22–32)
CREATININE: 0.93 mg/dL (ref 0.61–1.24)
Chloride: 106 mmol/L (ref 101–111)
Glucose, Bld: 168 mg/dL — ABNORMAL HIGH (ref 65–99)
Potassium: 3.8 mmol/L (ref 3.5–5.1)
SODIUM: 138 mmol/L (ref 135–145)

## 2017-01-16 MED ORDER — ONDANSETRON HCL 4 MG/2ML IJ SOLN
4.0000 mg | Freq: Once | INTRAMUSCULAR | Status: AC
  Administered 2017-01-16: 4 mg via INTRAVENOUS
  Filled 2017-01-16: qty 2

## 2017-01-16 MED ORDER — SODIUM CHLORIDE 0.9 % IV BOLUS (SEPSIS)
500.0000 mL | Freq: Once | INTRAVENOUS | Status: AC
  Administered 2017-01-16: 500 mL via INTRAVENOUS

## 2017-01-16 MED ORDER — MORPHINE SULFATE (PF) 4 MG/ML IV SOLN
4.0000 mg | Freq: Once | INTRAVENOUS | Status: AC
  Administered 2017-01-16: 4 mg via INTRAVENOUS
  Filled 2017-01-16: qty 1

## 2017-01-16 MED ORDER — TRAMADOL HCL 50 MG PO TABS: 50.0000 mg | ORAL_TABLET | Freq: Four times a day (QID) | ORAL | 0 refills | Status: DC | PRN

## 2017-01-16 NOTE — ED Provider Notes (Signed)
[facility] Provider Note   CSN: [id number] Arrival date & time: 01/16/17  1928  By signing my name below, I, [name], attest that this documentation has been prepared under the direction and in the presence of [name], MD. Electronically Signed: [name], ED Scribe. 01/16/17. 9:33 PM.  History   Chief Complaint Chief Complaint  Patient presents with  . Flank Pain    HPI Christopher Hayden is a 61 y.o. male with a PMHx of DM and kidney stones, who presents to the Emergency Department complaining of sharp, stabbing, constant, right flank pain that started Monday, 01/13/17, worsening today. Pt thinks he has a kidney stone and notes the last time he had one was many years ago. Associated sx include mild abdominal pain, and hematuria. Pt states in the past, he passed a kidney stone, had a stone blasted and one had to be removed. He notes that after he quit drinking tea he hadn't had a kidney stone until this past week. Pt has been moving around a lot this past week which hasn't helped his discomfort. Pt denies fever, nausea, vomiting, or any other complaints at this time.    The history is provided by the patient and the spouse. No language interpreter was used.    Past Medical History:  Diagnosis Date  . Diabetes mellitus without complication ([name])   . Kidney stone     There are no active problems to display for this patient.   Past Surgical History:  Procedure Laterality Date  . BACK SURGERY    . KNEE SURGERY         Home Medications    Prior to Admission medications   Medication Sig Start Date End Date Taking? Authorizing Provider  amoxicillin (AMOXIL) 500 MG capsule Take 1 capsule (500 mg total) by mouth 3 (three) times daily. 06/20/16   [name], MD  aspirin (ASPIRIN EC) 81 MG EC tablet Take 81 mg by mouth at bedtime.     [provider]  HYDROcodone-acetaminophen (NORCO/VICODIN) 5-325 MG per tablet Take 1-2 tablets by mouth  every 6 (six) hours as needed for moderate pain. Patient not taking: Reported on 06/08/2014 06/19/13   [name], [name], MD  insulin aspart (NOVOLOG) 100 UNIT/ML injection Inject 5-24 Units into the skin 3 (three) times daily with meals. On Sliding Scale    [provider]  insulin glargine (LANTUS) 100 UNIT/ML injection Inject 10-26 Units into the skin 2 (two) times daily. Takes 10 units with breakfast and 26 units at bedtime    [provider]  ondansetron (ZOFRAN ODT) 8 MG disintegrating tablet Take 1 tablet (8 mg total) by mouth every 8 (eight) hours as needed. Patient not taking: Reported on 06/08/2014 06/19/13   [name], [name], MD  oxyCODONE-acetaminophen (PERCOCET/ROXICET) 5-325 MG tablet Take 1 tablet by mouth every 4 (four) hours as needed for severe pain. 06/20/16   [name], MD  pravastatin (PRAVACHOL) 80 MG tablet Take 80 mg by mouth at bedtime.    [provider]  tetrahydrozoline (VISINE) 0.05 % ophthalmic solution Place 2 drops into both eyes daily as needed (for alleriges and dry eyes).    [provider]  traMADol (ULTRAM) 50 MG tablet Take 1 tablet (50 mg total) by mouth every 6 (six) hours as needed. 01/16/17   [name], MD    Family History No family history on file.  Social History Social History  Substance Use Topics  . Smoking status: Former Research scientist (life sciences)  .  Smokeless tobacco: Never Used  . Alcohol use No     Allergies   Metformin and related; Meloxicam; Naproxen; and Zetia [ezetimibe]   Review of Systems Review of Systems  Constitutional: Negative for fever.  Gastrointestinal: Positive for abdominal pain. Negative for nausea and vomiting.  Genitourinary: Positive for flank pain and hematuria.  All other systems reviewed and are negative.    Physical Exam Updated Vital Signs BP 120/77   Pulse 69   Temp 98.1 F (36.7 C) (Oral)   Resp 18   Ht 5\' 11"  (1.803 m)   Wt 89.8 kg (198 lb)   SpO2 99%   BMI 27.62 kg/m    Physical Exam  Constitutional: He is oriented to person, place, and time. He appears well-developed and well-nourished.  HENT:  Head: Normocephalic and atraumatic.  Cardiovascular: Normal rate and regular rhythm.   No murmur heard. Pulmonary/Chest: Effort normal and breath sounds normal. No respiratory distress.  Abdominal: Soft. There is no tenderness. There is no rebound and no guarding.  Musculoskeletal: He exhibits no edema or tenderness.  Neurological: He is alert and oriented to person, place, and time.  Skin: Skin is warm and dry.  Psychiatric: He has a normal mood and affect. His behavior is normal.  Nursing note and vitals reviewed.    ED Treatments / Results  DIAGNOSTIC STUDIES: Oxygen Saturation is 98% on RA, normal by my interpretation.   COORDINATION OF CARE: 9:33 PM-Discussed next steps with pt which includes a CT scan of his abdomen and getting morphine for pain. Pt verbalized understanding and is agreeable with the plan.   Labs (all labs ordered are listed, but only abnormal results are displayed) Labs Reviewed  URINALYSIS, ROUTINE W REFLEX MICROSCOPIC - Abnormal; Notable for the following:       Result Value   Hgb urine dipstick SMALL (*)    Ketones, ur 15 (*)    All other components within normal limits  URINALYSIS, MICROSCOPIC (REFLEX) - Abnormal; Notable for the following:    Bacteria, UA RARE (*)    Squamous Epithelial / LPF 0-5 (*)    All other components within normal limits  BASIC METABOLIC PANEL - Abnormal; Notable for the following:    Glucose, Bld 168 (*)    BUN 26 (*)    Calcium 8.7 (*)    All other components within normal limits  URINE CULTURE  CBC WITH DIFFERENTIAL/PLATELET    EKG  EKG Interpretation None       Radiology Ct Renal Stone Study  Result Date: 01/16/2017 CLINICAL DATA:  Right flank pain. EXAM: CT ABDOMEN AND PELVIS WITHOUT CONTRAST TECHNIQUE: Multidetector CT imaging of the abdomen and pelvis was performed following  the standard protocol without IV contrast. COMPARISON:  None. FINDINGS: Lower chest: No acute abnormality. Hepatobiliary: No focal liver abnormality is seen. No gallstones, gallbladder wall thickening, or biliary dilatation. Pancreas: Unremarkable. No pancreatic ductal dilatation or surrounding inflammatory changes. Spleen: Normal in size without focal abnormality. Adrenals/Urinary Tract: 5 mm nonobstructing calculus is present in the lower pole of the right kidney. Smaller calculus present in the upper pole collecting system of the right kidney. 3 mm nonobstructing calculus present in the lower pole of the left kidney. No ureteral calculi identified. The bladder is unremarkable. Adrenal glands are unremarkable. Stomach/Bowel: Bowel shows no evidence of obstruction or inflammation. No free air. Vascular/Lymphatic: Calcified plaque is present in the abdominal aorta bilateral iliac arteries without evidence of aneurysm. No enlarged lymph nodes are identified in the  abdomen or pelvis. Reproductive: Prostate is unremarkable. Other: No abdominal wall hernia or abnormality. No abscess or ascites. Musculoskeletal: The lumbar spine demonstrates degenerative disc disease with moderate disc space narrowing present at L3-4, L4-5 and L5-S1. IMPRESSION: 1. Bilateral nonobstructing renal calculi. The largest is a 5 mm calculus in the lower pole collecting system of the right kidney. No ureteral calculi identified. 2. Aortoiliac atherosclerosis without evidence of aneurysm. 3. Lumbar degenerative disc disease. Electronically Signed   By: Aletta Edouard M.D.   On: 01/16/2017 22:01    Procedures Procedures (including critical care time)  Medications Ordered in ED Medications  morphine 4 MG/ML injection 4 mg (4 mg Intravenous Given 01/16/17 2201)  sodium chloride 0.9 % bolus 500 mL (0 mLs Intravenous Stopped 01/16/17 2233)  ondansetron (ZOFRAN) injection 4 mg (4 mg Intravenous Given 01/16/17 2201)     Initial Impression /  Assessment and Plan / ED Course  I have reviewed the triage vital signs and the nursing notes.  Pertinent labs & imaging results that were available during my care of the patient were reviewed by me and considered in my medical decision making (see chart for details).     Patient here for evaluation of right flank pain. He is in no distress in the department. CT scan obtained that was negative for acute obstructing stone. Presentation is not consistent with dissection, UTI. Counseled pt on home care for flank pain, likely musculoskeletal in nature.   Counseled patient on home care, outpatient follow up and return precautions.   Final Clinical Impressions(s) / ED Diagnoses   Final diagnoses:  Right flank pain    New Prescriptions Discharge Medication List as of 01/16/2017 10:49 PM    START taking these medications   Details  traMADol (ULTRAM) 50 MG tablet Take 1 tablet (50 mg total) by mouth every 6 (six) hours as needed., Starting Thu 01/16/2017, Print       I personally performed the services described in this documentation, which was scribed in my presence. The recorded information has been reviewed and is accurate.    [name], MD 01/17/17 [phone]

## 2017-01-16 NOTE — ED Triage Notes (Signed)
Right flank pain x 3 days. He thinks he has a kidney stone.

## 2017-01-16 NOTE — Discharge Instructions (Signed)
There is a 5 mm stone in your right kidney.  It does not appear to be causing your pain at this time.  Get rechecked immediately if you develop fevers, can't urinate, or uncontrolled pain.

## 2017-01-16 NOTE — ED Notes (Signed)
Right flank pain has been going on for 3 days but it is getting worst today.  Pt stated that the had a kidney stone 20 years  and he stated that the pain is almost the same are before.

## 2017-01-18 LAB — URINE CULTURE: Culture: NO GROWTH

## 2017-01-20 DIAGNOSIS — E785 Hyperlipidemia, unspecified: Secondary | ICD-10-CM | POA: Diagnosis not present

## 2017-01-20 DIAGNOSIS — E1165 Type 2 diabetes mellitus with hyperglycemia: Secondary | ICD-10-CM | POA: Diagnosis not present

## 2017-01-20 DIAGNOSIS — R531 Weakness: Secondary | ICD-10-CM | POA: Diagnosis not present

## 2017-01-20 DIAGNOSIS — E11649 Type 2 diabetes mellitus with hypoglycemia without coma: Secondary | ICD-10-CM | POA: Diagnosis not present

## 2017-01-20 DIAGNOSIS — M545 Low back pain: Secondary | ICD-10-CM | POA: Diagnosis not present

## 2017-01-20 DIAGNOSIS — R7309 Other abnormal glucose: Secondary | ICD-10-CM | POA: Diagnosis not present

## 2017-08-21 DIAGNOSIS — Z794 Long term (current) use of insulin: Secondary | ICD-10-CM | POA: Diagnosis not present

## 2017-08-21 DIAGNOSIS — E1165 Type 2 diabetes mellitus with hyperglycemia: Secondary | ICD-10-CM | POA: Diagnosis not present

## 2017-08-21 DIAGNOSIS — E782 Mixed hyperlipidemia: Secondary | ICD-10-CM | POA: Diagnosis not present

## 2017-08-27 DIAGNOSIS — M5117 Intervertebral disc disorders with radiculopathy, lumbosacral region: Secondary | ICD-10-CM | POA: Diagnosis not present

## 2017-08-27 DIAGNOSIS — M48061 Spinal stenosis, lumbar region without neurogenic claudication: Secondary | ICD-10-CM | POA: Diagnosis not present

## 2017-08-27 DIAGNOSIS — M5416 Radiculopathy, lumbar region: Secondary | ICD-10-CM | POA: Diagnosis not present

## 2017-09-03 DIAGNOSIS — M5416 Radiculopathy, lumbar region: Secondary | ICD-10-CM | POA: Diagnosis not present

## 2017-09-03 DIAGNOSIS — M5126 Other intervertebral disc displacement, lumbar region: Secondary | ICD-10-CM | POA: Diagnosis not present

## 2017-09-03 DIAGNOSIS — M545 Low back pain: Secondary | ICD-10-CM | POA: Diagnosis not present

## 2017-09-03 DIAGNOSIS — Z6828 Body mass index (BMI) 28.0-28.9, adult: Secondary | ICD-10-CM | POA: Diagnosis not present

## 2017-09-03 DIAGNOSIS — R030 Elevated blood-pressure reading, without diagnosis of hypertension: Secondary | ICD-10-CM | POA: Diagnosis not present

## 2017-09-24 DIAGNOSIS — M7672 Peroneal tendinitis, left leg: Secondary | ICD-10-CM | POA: Diagnosis not present

## 2017-09-24 DIAGNOSIS — E139 Other specified diabetes mellitus without complications: Secondary | ICD-10-CM | POA: Diagnosis not present

## 2017-09-24 DIAGNOSIS — M7671 Peroneal tendinitis, right leg: Secondary | ICD-10-CM | POA: Diagnosis not present

## 2017-09-25 DIAGNOSIS — M5126 Other intervertebral disc displacement, lumbar region: Secondary | ICD-10-CM | POA: Diagnosis not present

## 2017-10-08 DIAGNOSIS — M7672 Peroneal tendinitis, left leg: Secondary | ICD-10-CM | POA: Diagnosis not present

## 2017-10-08 DIAGNOSIS — M7671 Peroneal tendinitis, right leg: Secondary | ICD-10-CM | POA: Diagnosis not present

## 2017-10-08 DIAGNOSIS — E139 Other specified diabetes mellitus without complications: Secondary | ICD-10-CM | POA: Diagnosis not present

## 2017-10-13 DIAGNOSIS — M5416 Radiculopathy, lumbar region: Secondary | ICD-10-CM | POA: Diagnosis not present

## 2017-10-13 DIAGNOSIS — R030 Elevated blood-pressure reading, without diagnosis of hypertension: Secondary | ICD-10-CM | POA: Diagnosis not present

## 2017-10-13 DIAGNOSIS — M7062 Trochanteric bursitis, left hip: Secondary | ICD-10-CM | POA: Diagnosis not present

## 2017-10-13 DIAGNOSIS — M545 Low back pain: Secondary | ICD-10-CM | POA: Diagnosis not present

## 2017-10-13 DIAGNOSIS — Z6828 Body mass index (BMI) 28.0-28.9, adult: Secondary | ICD-10-CM | POA: Diagnosis not present

## 2017-10-13 DIAGNOSIS — M48061 Spinal stenosis, lumbar region without neurogenic claudication: Secondary | ICD-10-CM | POA: Diagnosis not present

## 2017-10-28 DIAGNOSIS — M47816 Spondylosis without myelopathy or radiculopathy, lumbar region: Secondary | ICD-10-CM | POA: Diagnosis not present

## 2017-11-12 DIAGNOSIS — E139 Other specified diabetes mellitus without complications: Secondary | ICD-10-CM | POA: Diagnosis not present

## 2017-11-12 DIAGNOSIS — M7672 Peroneal tendinitis, left leg: Secondary | ICD-10-CM | POA: Diagnosis not present

## 2017-11-12 DIAGNOSIS — M7671 Peroneal tendinitis, right leg: Secondary | ICD-10-CM | POA: Diagnosis not present

## 2017-11-13 DIAGNOSIS — M47816 Spondylosis without myelopathy or radiculopathy, lumbar region: Secondary | ICD-10-CM | POA: Diagnosis not present

## 2017-11-13 DIAGNOSIS — M545 Low back pain: Secondary | ICD-10-CM | POA: Diagnosis not present

## 2017-11-27 DIAGNOSIS — E782 Mixed hyperlipidemia: Secondary | ICD-10-CM | POA: Diagnosis not present

## 2017-11-27 DIAGNOSIS — E1165 Type 2 diabetes mellitus with hyperglycemia: Secondary | ICD-10-CM | POA: Diagnosis not present

## 2017-11-27 DIAGNOSIS — Z794 Long term (current) use of insulin: Secondary | ICD-10-CM | POA: Diagnosis not present

## 2017-12-16 DIAGNOSIS — M47816 Spondylosis without myelopathy or radiculopathy, lumbar region: Secondary | ICD-10-CM | POA: Diagnosis not present

## 2017-12-16 DIAGNOSIS — M545 Low back pain: Secondary | ICD-10-CM | POA: Diagnosis not present

## 2017-12-16 DIAGNOSIS — M48061 Spinal stenosis, lumbar region without neurogenic claudication: Secondary | ICD-10-CM | POA: Diagnosis not present

## 2018-04-30 DIAGNOSIS — Z01818 Encounter for other preprocedural examination: Secondary | ICD-10-CM | POA: Diagnosis not present

## 2018-07-16 DIAGNOSIS — E7800 Pure hypercholesterolemia, unspecified: Secondary | ICD-10-CM | POA: Diagnosis present

## 2018-07-16 DIAGNOSIS — M4316 Spondylolisthesis, lumbar region: Secondary | ICD-10-CM | POA: Diagnosis not present

## 2018-07-16 DIAGNOSIS — Z794 Long term (current) use of insulin: Secondary | ICD-10-CM | POA: Diagnosis not present

## 2018-07-16 DIAGNOSIS — Z79899 Other long term (current) drug therapy: Secondary | ICD-10-CM | POA: Diagnosis not present

## 2018-07-16 DIAGNOSIS — M5136 Other intervertebral disc degeneration, lumbar region: Secondary | ICD-10-CM | POA: Diagnosis not present

## 2018-07-16 DIAGNOSIS — Z888 Allergy status to other drugs, medicaments and biological substances status: Secondary | ICD-10-CM | POA: Diagnosis not present

## 2018-07-16 DIAGNOSIS — M5416 Radiculopathy, lumbar region: Secondary | ICD-10-CM | POA: Diagnosis not present

## 2018-07-16 DIAGNOSIS — E119 Type 2 diabetes mellitus without complications: Secondary | ICD-10-CM | POA: Diagnosis present

## 2018-07-16 DIAGNOSIS — M5116 Intervertebral disc disorders with radiculopathy, lumbar region: Secondary | ICD-10-CM | POA: Diagnosis present

## 2018-07-20 ENCOUNTER — Encounter (HOSPITAL_BASED_OUTPATIENT_CLINIC_OR_DEPARTMENT_OTHER): Payer: Self-pay | Admitting: Emergency Medicine

## 2018-07-20 ENCOUNTER — Emergency Department (HOSPITAL_BASED_OUTPATIENT_CLINIC_OR_DEPARTMENT_OTHER): Payer: Managed Care, Other (non HMO)

## 2018-07-20 ENCOUNTER — Other Ambulatory Visit: Payer: Self-pay

## 2018-07-20 ENCOUNTER — Emergency Department (HOSPITAL_BASED_OUTPATIENT_CLINIC_OR_DEPARTMENT_OTHER)
Admission: EM | Admit: 2018-07-20 | Discharge: 2018-07-20 | Disposition: A | Discharge status: Home / Self Care | Payer: Managed Care, Other (non HMO) | Attending: Emergency Medicine | Admitting: Emergency Medicine

## 2018-07-20 DIAGNOSIS — R109 Unspecified abdominal pain: Secondary | ICD-10-CM | POA: Insufficient documentation

## 2018-07-20 DIAGNOSIS — E119 Type 2 diabetes mellitus without complications: Secondary | ICD-10-CM | POA: Insufficient documentation

## 2018-07-20 DIAGNOSIS — R339 Retention of urine, unspecified: Secondary | ICD-10-CM | POA: Diagnosis present

## 2018-07-20 DIAGNOSIS — Z87891 Personal history of nicotine dependence: Secondary | ICD-10-CM | POA: Diagnosis not present

## 2018-07-20 DIAGNOSIS — N451 Epididymitis: Secondary | ICD-10-CM | POA: Diagnosis not present

## 2018-07-20 DIAGNOSIS — Z7982 Long term (current) use of aspirin: Secondary | ICD-10-CM | POA: Insufficient documentation

## 2018-07-20 DIAGNOSIS — Z794 Long term (current) use of insulin: Secondary | ICD-10-CM | POA: Insufficient documentation

## 2018-07-20 DIAGNOSIS — Z9889 Other specified postprocedural states: Secondary | ICD-10-CM | POA: Diagnosis not present

## 2018-07-20 DIAGNOSIS — Z79899 Other long term (current) drug therapy: Secondary | ICD-10-CM | POA: Diagnosis not present

## 2018-07-20 DIAGNOSIS — R1909 Other intra-abdominal and pelvic swelling, mass and lump: Secondary | ICD-10-CM | POA: Diagnosis not present

## 2018-07-20 LAB — BASIC METABOLIC PANEL
ANION GAP: 12 (ref 5–15)
BUN: 17 mg/dL (ref 8–23)
CO2: 26 mmol/L (ref 22–32)
Calcium: 8.6 mg/dL — ABNORMAL LOW (ref 8.9–10.3)
Chloride: 97 mmol/L — ABNORMAL LOW (ref 98–111)
Creatinine, Ser: 0.76 mg/dL (ref 0.61–1.24)
GLUCOSE: 108 mg/dL — AB (ref 70–99)
POTASSIUM: 3.6 mmol/L (ref 3.5–5.1)
Sodium: 135 mmol/L (ref 135–145)

## 2018-07-20 LAB — CBC WITH DIFFERENTIAL/PLATELET
ABS IMMATURE GRANULOCYTES: 0.03 10*3/uL (ref 0.00–0.07)
BASOS ABS: 0 10*3/uL (ref 0.0–0.1)
BASOS PCT: 0 %
EOS ABS: 0.3 10*3/uL (ref 0.0–0.5)
Eosinophils Relative: 4 %
HCT: 45.5 % (ref 39.0–52.0)
Hemoglobin: 14.6 g/dL (ref 13.0–17.0)
IMMATURE GRANULOCYTES: 0 %
Lymphocytes Relative: 21 %
Lymphs Abs: 1.5 10*3/uL (ref 0.7–4.0)
MCH: 30 pg (ref 26.0–34.0)
MCHC: 32.1 g/dL (ref 30.0–36.0)
MCV: 93.6 fL (ref 80.0–100.0)
MONOS PCT: 9 %
Monocytes Absolute: 0.6 10*3/uL (ref 0.1–1.0)
NEUTROS ABS: 4.8 10*3/uL (ref 1.7–7.7)
NEUTROS PCT: 66 %
NRBC: 0 % (ref 0.0–0.2)
PLATELETS: 279 10*3/uL (ref 150–400)
RBC: 4.86 MIL/uL (ref 4.22–5.81)
RDW: 12.2 % (ref 11.5–15.5)
WBC: 7.3 10*3/uL (ref 4.0–10.5)

## 2018-07-20 LAB — URINALYSIS, ROUTINE W REFLEX MICROSCOPIC
Bilirubin Urine: NEGATIVE
GLUCOSE, UA: NEGATIVE mg/dL
Hgb urine dipstick: NEGATIVE
Ketones, ur: 15 mg/dL — AB
LEUKOCYTES UA: NEGATIVE
NITRITE: NEGATIVE
PROTEIN: NEGATIVE mg/dL
Specific Gravity, Urine: 1.01 (ref 1.005–1.030)
pH: 7 (ref 5.0–8.0)

## 2018-07-20 MED ORDER — CIPROFLOXACIN HCL 500 MG PO TABS: 500.0000 mg | ORAL_TABLET | Freq: Two times a day (BID) | ORAL | 0 refills | Status: DC

## 2018-07-20 MED ORDER — ONDANSETRON HCL 4 MG/2ML IJ SOLN
4.0000 mg | Freq: Once | INTRAMUSCULAR | Status: AC
  Administered 2018-07-20: 4 mg via INTRAVENOUS
  Filled 2018-07-20: qty 2

## 2018-07-20 MED ORDER — MORPHINE SULFATE (PF) 4 MG/ML IV SOLN
4.0000 mg | Freq: Once | INTRAVENOUS | Status: AC
  Administered 2018-07-20: 4 mg via INTRAVENOUS
  Filled 2018-07-20: qty 1

## 2018-07-20 NOTE — ED Provider Notes (Signed)
[facility] EMERGENCY DEPARTMENT Provider Note   CSN: [id number] Arrival date & time: 07/20/18  8563     History   Chief Complaint Chief Complaint  Patient presents with  . Urinary Retention    HPI Christopher Hayden is a 63 y.o. male.  Patient is a 63 year old male with past medical history of diabetes, renal calculi, and recent back surgery.  He had a catheter during his back surgery.  He presents today with complaints of testicle discomfort and difficulty urinating.  He has been unable to urinate for the past day.  He denies any fevers or chills.  He denies any worsening back pain or radiation to the legs.  The history is provided by the patient.    Past Medical History:  Diagnosis Date  . Diabetes mellitus without complication ([place])   . Kidney stone     There are no active problems to display for this patient.   Past Surgical History:  Procedure Laterality Date  . BACK SURGERY    . KNEE SURGERY          Home Medications    Prior to Admission medications   Medication Sig Start Date End Date Taking? Authorizing Provider  amoxicillin (AMOXIL) 500 MG capsule Take 1 capsule (500 mg total) by mouth 3 (three) times daily. 06/20/16   [name], MD  aspirin (ASPIRIN EC) 81 MG EC tablet Take 81 mg by mouth at bedtime.     [provider]  HYDROcodone-acetaminophen (NORCO/VICODIN) 5-325 MG per tablet Take 1-2 tablets by mouth every 6 (six) hours as needed for moderate pain. Patient not taking: Reported on 06/08/2014 06/19/13   [name], [name], MD  insulin aspart (NOVOLOG) 100 UNIT/ML injection Inject 5-24 Units into the skin 3 (three) times daily with meals. On Sliding Scale    [provider]  insulin glargine (LANTUS) 100 UNIT/ML injection Inject 10-26 Units into the skin 2 (two) times daily. Takes 10 units with breakfast and 26 units at bedtime    [provider]  ondansetron (ZOFRAN ODT) 8 MG disintegrating tablet Take 1 tablet (8 mg  total) by mouth every 8 (eight) hours as needed. Patient not taking: Reported on 06/08/2014 06/19/13   [name], [name], MD  oxyCODONE-acetaminophen (PERCOCET/ROXICET) 5-325 MG tablet Take 1 tablet by mouth every 4 (four) hours as needed for severe pain. 06/20/16   [name], MD  pravastatin (PRAVACHOL) 80 MG tablet Take 80 mg by mouth at bedtime.    [provider]  tetrahydrozoline (VISINE) 0.05 % ophthalmic solution Place 2 drops into both eyes daily as needed (for alleriges and dry eyes).    [provider]  traMADol (ULTRAM) 50 MG tablet Take 1 tablet (50 mg total) by mouth every 6 (six) hours as needed. 01/16/17   [name], MD    Family History No family history on file.  Social History Social History   Tobacco Use  . Smoking status: Former Research scientist (life sciences)  . Smokeless tobacco: Never Used  Substance Use Topics  . Alcohol use: No  . Drug use: No     Allergies   Metformin and related; Meloxicam; Naproxen; Zetia [ezetimibe]; and Toradol [ketorolac tromethamine]   Review of Systems Review of Systems  All other systems reviewed and are negative.    Physical Exam Updated Vital Signs BP 133/89   Pulse 99   Temp (!) 97.5 F (36.4 C) (Oral)   Resp 20   Ht 5\' 11"  (1.803 m)   Wt 89.8 kg  SpO2 100%   BMI 27.62 kg/m   Physical Exam Vitals signs and nursing note reviewed.  Constitutional:      General: He is not in acute distress.    Appearance: He is well-developed. He is not diaphoretic.  HENT:     Head: Normocephalic and atraumatic.  Neck:     Musculoskeletal: Normal range of motion and neck supple.  Cardiovascular:     Rate and Rhythm: Normal rate and regular rhythm.     Heart sounds: No murmur. No friction rub.  Pulmonary:     Effort: Pulmonary effort is normal. No respiratory distress.     Breath sounds: Normal breath sounds. No wheezing or rales.  Abdominal:     General: Bowel sounds are normal. There is no distension.     Palpations:  Abdomen is soft.     Tenderness: There is no abdominal tenderness.  Genitourinary:    Penis: Normal.      Scrotum/Testes: Normal.     Comments: There is no testicular swelling.  The testes are freely mobile within the scrotum.  There are no scrotal lesions. Musculoskeletal: Normal range of motion.  Skin:    General: Skin is warm and dry.  Neurological:     Mental Status: He is alert and oriented to person, place, and time.     Coordination: Coordination normal.      ED Treatments / Results  Labs (all labs ordered are listed, but only abnormal results are displayed) Labs Reviewed  URINALYSIS, ROUTINE W REFLEX MICROSCOPIC - Abnormal; Notable for the following components:      Result Value   Ketones, ur 15 (*)    All other components within normal limits  BASIC METABOLIC PANEL - Abnormal; Notable for the following components:   Chloride 97 (*)    Glucose, Bld 108 (*)    Calcium 8.6 (*)    All other components within normal limits  CBC WITH DIFFERENTIAL/PLATELET    EKG None  Radiology Ct Renal Stone Study  Result Date: 07/20/2018 CLINICAL DATA:  63 y/o M; flank pain, stone disease suspected. History of back surgery Thursday morning. EXAM: CT ABDOMEN AND PELVIS WITHOUT CONTRAST TECHNIQUE: Multidetector CT imaging of the abdomen and pelvis was performed following the standard protocol without IV contrast. COMPARISON:  01/16/2017 CT abdomen and pelvis. 03/10/2018 lumbar spine MRI. FINDINGS: Lower chest: Platelike atelectasis at the lung bases. Small pericardial effusion. Hepatobiliary: No focal liver abnormality is seen. No gallstones, gallbladder wall thickening, or biliary dilatation. Pancreas: Unremarkable. No pancreatic ductal dilatation or surrounding inflammatory changes. Spleen: Normal in size without focal abnormality. Adrenals/Urinary Tract: Normal adrenal glands. 18 mm cyst within the left interpolar kidney. Punctate nonobstructing stones in the left kidney. No hydronephrosis  or ureter stone. No additional focal kidney lesion. Normal bladder. Stomach/Bowel: Stomach is within normal limits. Appendix not identified, no pericecal inflammation. No evidence of bowel wall thickening, distention, or inflammatory changes. Vascular/Lymphatic: Aortic atherosclerosis. No enlarged abdominal or pelvic lymph nodes. Reproductive: Negative. Other: No abdominal wall hernia or abnormality. No abdominopelvic ascites. Musculoskeletal: There is edema within the left lateral abdominal wall extending into the left-sided retroperitoneum with several small foci of air compatible with recent postoperative changes. Edema extends along the anterior margin of the iliopsoas muscles. No hematoma identified. L3-4 PLIF and interbody fusion. No periprosthetic lucency or fracture identified., hardware is intact. The vertebral bodies and spinal canal the levels of fusion are partially obscured by streak artifact. L4-5 left subarticular disc protrusion appears increased in size  from prior lumbar spine MRI given differences in technique. IMPRESSION: 1. Edema within the left lateral abdominal wall extending into the left-sided retroperitoneum with several small foci of air compatible with recent postoperative changes. No hematoma identified. Edema extends inferiorly overlying the left iliopsoas muscle in the pelvis. 2. Left kidney punctate nonobstructing stones. No hydronephrosis or ureter stone. 3. Small pericardial effusion. 4. L4-5 left subarticular disc protrusion appears increased in size from prior lumbar spine MRI given differences in technique. 5. L3-4 PLIF and interbody fusion without apparent hardware related complication. 6. Aortic Atherosclerosis (ICD10-I70.0). Electronically Signed   By: Kristine Garbe M.D.   On: 07/20/2018 05:28    Procedures Procedures (including critical care time)  Medications Ordered in ED Medications  morphine 4 MG/ML injection 4 mg (4 mg Intravenous Given 07/20/18 0528)    ondansetron (ZOFRAN) injection 4 mg (4 mg Intravenous Given 07/20/18 0528)     Initial Impression / Assessment and Plan / ED Course  I have reviewed the triage vital signs and the nursing notes.  Pertinent labs & imaging results that were available during my care of the patient were reviewed by me and considered in my medical decision making (see chart for details).  Patient presenting with complaints of urinary retention and testicle pain.  He was able to urinate upon arrival to the ER.  His urine sample was clear and bladder scanner revealed no urinary retention.  CT scan was performed showing no evidence for renal calculus or other obvious abnormality.  He did have some edema within the back and abdominal wall consistent with postoperative change.  Patient is feeling better after medications given in the ER.  Due to the patient's testicle pain, he will be given Cipro for presumed epididymitis.  I see no other obvious abnormality that requires emergent attention.  He has a follow-up appointment on Thursday with his neurosurgeon and I have advised him to keep this appointment.  Final Clinical Impressions(s) / ED Diagnoses   Final diagnoses:  None    ED Discharge Orders    None       Veryl Speak, MD 07/20/18 [phone]

## 2018-07-20 NOTE — ED Triage Notes (Signed)
Pt had back surgery Thursday morning in [place]. Pt had to be cathed. After that he was able to be until late yesterday evening. He states that his right testicle started hurting now both are hurting. Denies swelling to them.

## 2018-07-20 NOTE — ED Notes (Signed)
Patient transported to CT 

## 2018-07-20 NOTE — Discharge Instructions (Addendum)
Cipro as prescribed.  Follow-up with your neurosurgeon as scheduled on Thursday, and return to the ER if you develop worsening pain, high fever, or other new and concerning symptoms.

## 2018-07-25 ENCOUNTER — Emergency Department (HOSPITAL_BASED_OUTPATIENT_CLINIC_OR_DEPARTMENT_OTHER): Payer: Managed Care, Other (non HMO)

## 2018-07-25 ENCOUNTER — Other Ambulatory Visit: Payer: Self-pay

## 2018-07-25 ENCOUNTER — Emergency Department (HOSPITAL_BASED_OUTPATIENT_CLINIC_OR_DEPARTMENT_OTHER)
Admission: EM | Admit: 2018-07-25 | Discharge: 2018-07-25 | Disposition: A | Discharge status: Home / Self Care | Payer: Managed Care, Other (non HMO) | Attending: Emergency Medicine | Admitting: Emergency Medicine

## 2018-07-25 ENCOUNTER — Encounter (HOSPITAL_BASED_OUTPATIENT_CLINIC_OR_DEPARTMENT_OTHER): Payer: Self-pay | Admitting: Adult Health

## 2018-07-25 DIAGNOSIS — N50819 Testicular pain, unspecified: Secondary | ICD-10-CM

## 2018-07-25 DIAGNOSIS — Z87891 Personal history of nicotine dependence: Secondary | ICD-10-CM | POA: Diagnosis not present

## 2018-07-25 DIAGNOSIS — E119 Type 2 diabetes mellitus without complications: Secondary | ICD-10-CM | POA: Insufficient documentation

## 2018-07-25 DIAGNOSIS — I861 Scrotal varices: Secondary | ICD-10-CM | POA: Diagnosis not present

## 2018-07-25 DIAGNOSIS — N50811 Right testicular pain: Secondary | ICD-10-CM | POA: Diagnosis present

## 2018-07-25 LAB — CBC WITH DIFFERENTIAL/PLATELET
ABS IMMATURE GRANULOCYTES: 0.04 10*3/uL (ref 0.00–0.07)
BASOS PCT: 0 %
Basophils Absolute: 0 10*3/uL (ref 0.0–0.1)
EOS ABS: 0.3 10*3/uL (ref 0.0–0.5)
Eosinophils Relative: 3 %
HCT: 44.7 % (ref 39.0–52.0)
Hemoglobin: 14.7 g/dL (ref 13.0–17.0)
Immature Granulocytes: 1 %
Lymphocytes Relative: 25 %
Lymphs Abs: 2.1 10*3/uL (ref 0.7–4.0)
MCH: 30.4 pg (ref 26.0–34.0)
MCHC: 32.9 g/dL (ref 30.0–36.0)
MCV: 92.5 fL (ref 80.0–100.0)
MONO ABS: 0.7 10*3/uL (ref 0.1–1.0)
Monocytes Relative: 9 %
NEUTROS ABS: 5.2 10*3/uL (ref 1.7–7.7)
NEUTROS PCT: 62 %
PLATELETS: 338 10*3/uL (ref 150–400)
RBC: 4.83 MIL/uL (ref 4.22–5.81)
RDW: 12 % (ref 11.5–15.5)
WBC: 8.3 10*3/uL (ref 4.0–10.5)
nRBC: 0 % (ref 0.0–0.2)

## 2018-07-25 LAB — URINALYSIS, ROUTINE W REFLEX MICROSCOPIC
BILIRUBIN URINE: NEGATIVE
GLUCOSE, UA: 100 mg/dL — AB
HGB URINE DIPSTICK: NEGATIVE
KETONES UR: NEGATIVE mg/dL
Leukocytes,Ua: NEGATIVE
Nitrite: NEGATIVE
PROTEIN: NEGATIVE mg/dL
Specific Gravity, Urine: 1.01 (ref 1.005–1.030)
pH: 7 (ref 5.0–8.0)

## 2018-07-25 LAB — BASIC METABOLIC PANEL
ANION GAP: 8 (ref 5–15)
BUN: 17 mg/dL (ref 8–23)
CALCIUM: 8.9 mg/dL (ref 8.9–10.3)
CHLORIDE: 100 mmol/L (ref 98–111)
CO2: 25 mmol/L (ref 22–32)
CREATININE: 0.94 mg/dL (ref 0.61–1.24)
Glucose, Bld: 204 mg/dL — ABNORMAL HIGH (ref 70–99)
Potassium: 4 mmol/L (ref 3.5–5.1)
Sodium: 133 mmol/L — ABNORMAL LOW (ref 135–145)

## 2018-07-25 MED ORDER — OXYCODONE-ACETAMINOPHEN 5-325 MG PO TABS
2.0000 | ORAL_TABLET | Freq: Once | ORAL | Status: AC
  Administered 2018-07-25: 2 via ORAL
  Filled 2018-07-25: qty 2

## 2018-07-25 MED ORDER — HYDROMORPHONE HCL 1 MG/ML IJ SOLN
1.0000 mg | Freq: Once | INTRAMUSCULAR | Status: AC
  Administered 2018-07-25: 1 mg via INTRAVENOUS

## 2018-07-25 MED ORDER — MORPHINE SULFATE (PF) 4 MG/ML IV SOLN
4.0000 mg | Freq: Once | INTRAVENOUS | Status: AC
  Administered 2018-07-25: 4 mg via INTRAVENOUS
  Filled 2018-07-25: qty 1

## 2018-07-25 MED ORDER — HYDROMORPHONE HCL 1 MG/ML IJ SOLN
INTRAMUSCULAR | Status: AC
  Filled 2018-07-25: qty 1

## 2018-07-25 MED ORDER — TAMSULOSIN HCL 0.4 MG PO CAPS: 0.4000 mg | ORAL_CAPSULE | Freq: Every day | ORAL | 0 refills | Status: AC

## 2018-07-25 NOTE — ED Triage Notes (Signed)
PT was seen here 5 days ago for r testicle pain. He had lumbar back surgery 9 days ago. HE reports that he is urinating. The rght testicle pain is now severe and is described as twisting.

## 2018-07-25 NOTE — ED Notes (Signed)
Patient transported to Ultrasound 

## 2018-07-25 NOTE — ED Notes (Signed)
Pt verbalized understanding of dc instructions.

## 2018-07-25 NOTE — ED Provider Notes (Signed)
Emergency Department Provider Note   I have reviewed the triage vital signs and the nursing notes.   HISTORY  Chief Complaint Testicle Pain   HPI NEFI MUSICH is a 63 y.o. male with PMH of DM, kidney stone, recent back surgery, and recent ED evaluation and diagnosis of epididymitis presents to the emergency department with severe, pain in the testicles starting last night.  Pain seems worse on the right.  No alleviating or modifying factors.  No provoking symptoms.  The patient states he is been urinating frequently.  No fevers.  He has continued back pain in the postoperative setting but not worse than normal.  No numbness or weakness in the legs.  Patient denies any flank pain or pain similar to his prior kidney stones.  Past Medical History:  Diagnosis Date  . Diabetes mellitus without complication ([place])   . Kidney stone     There are no active problems to display for this patient.   Past Surgical History:  Procedure Laterality Date  . BACK SURGERY    . KNEE SURGERY     Allergies Metformin and related; Meloxicam; Naproxen; Zetia [ezetimibe]; and Toradol [ketorolac tromethamine]  History reviewed. No pertinent family history.  Social History Social History   Tobacco Use  . Smoking status: Former Research scientist (life sciences)  . Smokeless tobacco: Never Used  Substance Use Topics  . Alcohol use: No  . Drug use: No    Review of Systems  Constitutional: No fever/chills Eyes: No visual changes. ENT: No sore throat. Cardiovascular: Denies chest pain. Respiratory: Denies shortness of breath. Gastrointestinal: No abdominal pain.  No nausea, no vomiting.  No diarrhea.  No constipation. Genitourinary: Negative for dysuria. Positive right testicle pain.  Musculoskeletal: Negative for back pain. Skin: Negative for rash. Neurological: Negative for headaches, focal weakness or numbness.  10-point ROS otherwise negative.  ____________________________________________   PHYSICAL  EXAM:  VITAL SIGNS: ED Triage Vitals  Enc Vitals Group     BP 07/25/18 0751 (!) 158/101     Pulse Rate 07/25/18 0751 95     Resp 07/25/18 0751 20     Temp 07/25/18 0751 98.4 F (36.9 C)     Temp Source 07/25/18 0751 Oral     SpO2 --      Weight 07/25/18 0752 197 lb 15.6 oz (89.8 kg)     Height 07/25/18 0752 5\' 11"  (1.803 m)     Pain Score 07/25/18 0751 10   Constitutional: Alert and oriented. Well appearing and in no acute distress. Eyes: Conjunctivae are normal. Head: Atraumatic. Nose: No congestion/rhinnorhea. Mouth/Throat: Mucous membranes are moist.  Oropharynx non-erythematous. Neck: No stridor.  Cardiovascular: Normal rate, regular rhythm. Good peripheral circulation. Grossly normal heart sounds.   Respiratory: Normal respiratory effort.  No retractions. Lungs CTAB. Gastrointestinal: Soft and nontender. No distention.  Genitourinary: Exam performed with nurse chaperone. No abnormal testicle lie. No significant testicular tenderness or edema. No scrotal edema or cellulitis. No appreciable inguinal hernia.  Musculoskeletal: No lower extremity tenderness nor edema. No gross deformities of extremities. Neurologic:  Normal speech and language. No gross focal neurologic deficits are appreciated.  Skin:  Skin is warm, dry and intact. No rash noted.  ____________________________________________   LABS (all labs ordered are listed, but only abnormal results are displayed)  Labs Reviewed  BASIC METABOLIC PANEL - Abnormal; Notable for the following components:      Result Value   Sodium 133 (*)    Glucose, Bld 204 (*)    All  other components within normal limits  URINALYSIS, ROUTINE W REFLEX MICROSCOPIC - Abnormal; Notable for the following components:   Glucose, UA 100 (*)    All other components within normal limits  URINE CULTURE  CBC WITH DIFFERENTIAL/PLATELET   ____________________________________________  RADIOLOGY  US Scrotum W/doppler  Result Date:  07/25/2018 CLINICAL DATA:  Acute onset RIGHT scrotal pain that began this morning. Postop lumbar fusion on 07/16/2018. EXAM: SCROTAL ULTRASOUND DOPPLER ULTRASOUND OF THE TESTICLES TECHNIQUE: Complete ultrasound examination of the testicles, epididymis, and other scrotal structures was performed. Color and spectral Doppler ultrasound were also utilized to evaluate blood flow to the testicles. COMPARISON:  None. FINDINGS: Right testicle Measurements: Approximately 4.2 x 2.1 x 2.7 cm. Normal parenchymal echotexture without mass or microlithiasis. Normal color Doppler flow without evidence of hyperemia. Left testicle Measurements: Approximately 4.7 x 1.9 x 2.8 cm. Normal parenchymal echotexture without mass or microlithiasis. Normal color Doppler flow without evidence of hyperemia. Right epididymis: Normal in size without evidence of hyperemia. Small cysts or spermatoceles measuring approximately 9 mm and 5 mm. Left epididymis: Normal in size without evidence of hyperemia. Small cysts or spermatoceles measuring approximately 4 mm and 3 mm. Hydrocele:  Absent bilaterally. Varicocele:  Present in the RIGHT hemiscrotum.  Absent on the LEFT. Pulsed Doppler interrogation of both testes demonstrates normal low resistance arterial and venous waveforms bilaterally. IMPRESSION: 1. RIGHT-sided varicocele. 2. Otherwise normal examination. Electronically Signed   By: Evangeline Dakin M.D.   On: 07/25/2018 10:14    ____________________________________________   PROCEDURES  Procedure(s) performed:   Procedures  None ____________________________________________   INITIAL IMPRESSION / ASSESSMENT AND PLAN / ED COURSE  Pertinent labs & imaging results that were available during my care of the patient were reviewed by me and considered in my medical decision making (see chart for details).  Patient presents with return of testicle pain after being diagnosed with epididymitis on 2/10.  He has been compliant with Cipro.   Pain is been ongoing since last night.  I do feel the patient will require ultrasound to evaluate for torsion but my suspicion for this diagnosis is lower given his normal testicle exam without pain, abnormal lie, edema.  Do not appreciate a hernia.  Patient is significantly uncomfortable.  Plan for ultrasound, labs, pain control, reassess.   Patient feeling slightly better on reassessment.  Ultrasound of the scrotum shows varicocele.  No evidence of torsion.  UA and labs are not consistent with infection.  I discussed the case with the urologist on-call who advises continuing Cipro, supportive underpants, and outpatient follow-up.  Patient is already on strong pain medications given his recent back surgery.  Given his surgery, not advised to take NSAIDs although this would typically help.  Discussed pain management plan at home and ED return precautions.  Contact information for outpatient urology provided at discharge. ____________________________________________  FINAL CLINICAL IMPRESSION(S) / ED DIAGNOSES  Final diagnoses:  Testicle pain     MEDICATIONS GIVEN DURING THIS VISIT:  Medications  morphine 4 MG/ML injection 4 mg (4 mg Intravenous Given 07/25/18 0758)  HYDROmorphone (DILAUDID) injection 1 mg (1 mg Intravenous Given 07/25/18 0908)  oxyCODONE-acetaminophen (PERCOCET/ROXICET) 5-325 MG per tablet 2 tablet (2 tablets Oral Given 07/25/18 1059)     NEW OUTPATIENT MEDICATIONS STARTED DURING THIS VISIT:  Discharge Medication List as of 07/25/2018 11:36 AM    START taking these medications   Details  tamsulosin (FLOMAX) 0.4 MG CAPS capsule Take 1 capsule (0.4 mg total) by mouth daily for 14  days., Starting Sat 07/25/2018, Until Sat 08/08/2018, Print        Note:  This document was prepared using Dragon voice recognition software and may include unintentional dictation errors.  Nanda Quinton, MD Emergency Medicine    , [facility], MD 07/25/18 1946

## 2018-07-25 NOTE — Discharge Instructions (Signed)
Call the Urologist on Monday for an appointment. Use supportive underwear and return to the ED with any fever, chills, or other suddenly worsening symptoms.

## 2018-07-26 ENCOUNTER — Other Ambulatory Visit: Payer: Self-pay

## 2018-07-26 ENCOUNTER — Emergency Department (HOSPITAL_BASED_OUTPATIENT_CLINIC_OR_DEPARTMENT_OTHER)
Admission: EM | Admit: 2018-07-26 | Discharge: 2018-07-27 | Disposition: A | Discharge status: Home / Self Care | Payer: Managed Care, Other (non HMO) | Attending: Emergency Medicine | Admitting: Emergency Medicine

## 2018-07-26 ENCOUNTER — Encounter (HOSPITAL_BASED_OUTPATIENT_CLINIC_OR_DEPARTMENT_OTHER): Payer: Self-pay

## 2018-07-26 DIAGNOSIS — Z87891 Personal history of nicotine dependence: Secondary | ICD-10-CM | POA: Insufficient documentation

## 2018-07-26 DIAGNOSIS — M5416 Radiculopathy, lumbar region: Secondary | ICD-10-CM | POA: Diagnosis not present

## 2018-07-26 DIAGNOSIS — G8918 Other acute postprocedural pain: Secondary | ICD-10-CM | POA: Insufficient documentation

## 2018-07-26 DIAGNOSIS — Z79899 Other long term (current) drug therapy: Secondary | ICD-10-CM | POA: Diagnosis not present

## 2018-07-26 DIAGNOSIS — Z794 Long term (current) use of insulin: Secondary | ICD-10-CM | POA: Insufficient documentation

## 2018-07-26 DIAGNOSIS — Z7982 Long term (current) use of aspirin: Secondary | ICD-10-CM | POA: Diagnosis not present

## 2018-07-26 DIAGNOSIS — E119 Type 2 diabetes mellitus without complications: Secondary | ICD-10-CM | POA: Insufficient documentation

## 2018-07-26 DIAGNOSIS — M79604 Pain in right leg: Secondary | ICD-10-CM | POA: Insufficient documentation

## 2018-07-26 LAB — URINE CULTURE
Culture: NO GROWTH
SPECIAL REQUESTS: NORMAL

## 2018-07-26 NOTE — ED Triage Notes (Signed)
Pt c/o R leg pain that started today and became worse tonight. Pt reports that it is a severe cramping like pain. Pt recently had back surgery on 2/6 and is worried that pain might be a DVT.

## 2018-07-26 NOTE — ED Notes (Signed)
Last po pain med was at 1200 today, and flexeril at 1400. Negative Homans Bilateral lower calves

## 2018-07-27 MED ORDER — HYDROMORPHONE HCL 1 MG/ML IJ SOLN
1.0000 mg | Freq: Once | INTRAMUSCULAR | Status: AC
  Administered 2018-07-27: 1 mg via INTRAVENOUS
  Filled 2018-07-27: qty 1

## 2018-07-27 MED ORDER — ONDANSETRON HCL 4 MG/2ML IJ SOLN
4.0000 mg | Freq: Once | INTRAMUSCULAR | Status: AC
  Administered 2018-07-27: 4 mg via INTRAVENOUS
  Filled 2018-07-27: qty 2

## 2018-07-27 NOTE — ED Notes (Signed)
ED Provider at bedside. 

## 2018-07-27 NOTE — ED Notes (Signed)
Pt appears more comfortable.

## 2018-07-27 NOTE — ED Provider Notes (Signed)
[facility] EMERGENCY DEPARTMENT Provider Note   CSN: [id number] Arrival date & time: 07/26/18  2321     History   Chief Complaint Chief Complaint  Patient presents with  . Leg Pain    HPI KAYLER RISE is a 63 y.o. male.  Patient presents to the emergency department with complaints of right leg pain.  Pain began earlier today and has worsened throughout the night.  Pain starts at the lateral aspect of the right hip and radiates down to the right knee.  It is a constant pain that worsens with movement of the leg.  Patient concerned because he had back surgery on February 6.  Everything seems fine with the incision site, no signs of infection, drainage no back pain.  He has not had any fever.  No change in sensation.     Past Medical History:  Diagnosis Date  . Diabetes mellitus without complication ([place])   . Kidney stone     There are no active problems to display for this patient.   Past Surgical History:  Procedure Laterality Date  . BACK SURGERY    . KNEE SURGERY    . NECK SURGERY          Home Medications    Prior to Admission medications   Medication Sig Start Date End Date Taking? Authorizing Provider  oxycodone (OXY-IR) 5 MG capsule Take 5 mg by mouth every 4 (four) hours as needed.   Yes [provider]  amoxicillin (AMOXIL) 500 MG capsule Take 1 capsule (500 mg total) by mouth 3 (three) times daily. 06/20/16   [name], MD  aspirin (ASPIRIN EC) 81 MG EC tablet Take 81 mg by mouth at bedtime.     [provider]  ciprofloxacin (CIPRO) 500 MG tablet Take 1 tablet (500 mg total) by mouth 2 (two) times daily. One po bid x 7 days 07/20/18   Veryl Speak, MD  cyclobenzaprine (FLEXERIL) 10 MG tablet Take 10 mg by mouth every 8 (eight) hours as needed. for pain 07/18/18   [provider]  HYDROcodone-acetaminophen (NORCO/VICODIN) 5-325 MG per tablet Take 1-2 tablets by mouth every 6 (six) hours as needed for moderate  pain. Patient not taking: Reported on 06/08/2014 06/19/13   [name], [name], MD  insulin aspart (NOVOLOG) 100 UNIT/ML injection Inject 5-24 Units into the skin 3 (three) times daily with meals. On Sliding Scale    [provider]  insulin glargine (LANTUS) 100 UNIT/ML injection Inject 10-26 Units into the skin 2 (two) times daily. Takes 10 units with breakfast and 26 units at bedtime    [provider]  ondansetron (ZOFRAN ODT) 8 MG disintegrating tablet Take 1 tablet (8 mg total) by mouth every 8 (eight) hours as needed. Patient not taking: Reported on 06/08/2014 06/19/13   [name], [name], MD  oxyCODONE-acetaminophen (PERCOCET/ROXICET) 5-325 MG tablet Take 1 tablet by mouth every 4 (four) hours as needed for severe pain. 06/20/16   [name], MD  pravastatin (PRAVACHOL) 80 MG tablet Take 80 mg by mouth at bedtime.    [provider]  tamsulosin (FLOMAX) 0.4 MG CAPS capsule Take 1 capsule (0.4 mg total) by mouth daily for 14 days. 07/25/18 08/08/18  Long, Wonda Olds, MD  tetrahydrozoline (VISINE) 0.05 % ophthalmic solution Place 2 drops into both eyes daily as needed (for alleriges and dry eyes).    [provider]  traMADol (ULTRAM) 50 MG tablet Take 1 tablet (50 mg total) by mouth every 6 (  six) hours as needed. 01/16/17   [name], MD    Family History No family history on file.  Social History Social History   Tobacco Use  . Smoking status: Former Research scientist (life sciences)  . Smokeless tobacco: Never Used  Substance Use Topics  . Alcohol use: No  . Drug use: No     Allergies   Metformin and related; Meloxicam; Naproxen; Zetia [ezetimibe]; and Toradol [ketorolac tromethamine]   Review of Systems Review of Systems  Musculoskeletal: Negative for back pain.  Neurological: Negative for weakness and numbness.  All other systems reviewed and are negative.    Physical Exam Updated Vital Signs BP 140/83 (BP Location: Right Arm)   Pulse 83   Temp 97.9 F (36.6 C)  (Oral)   Resp 18   Ht 5\' 11"  (1.803 m)   Wt 89.8 kg   SpO2 98%   BMI 27.62 kg/m   Physical Exam Vitals signs and nursing note reviewed.  Constitutional:      General: He is not in acute distress.    Appearance: Normal appearance. He is well-developed.  HENT:     Head: Normocephalic and atraumatic.     Right Ear: Hearing normal.     Left Ear: Hearing normal.     Nose: Nose normal.  Eyes:     Conjunctiva/sclera: Conjunctivae normal.     Pupils: Pupils are equal, round, and reactive to light.  Neck:     Musculoskeletal: Normal range of motion and neck supple.  Cardiovascular:     Rate and Rhythm: Regular rhythm.     Heart sounds: S1 normal and S2 normal. No murmur. No friction rub. No gallop.   Pulmonary:     Effort: Pulmonary effort is normal. No respiratory distress.     Breath sounds: Normal breath sounds.  Chest:     Chest wall: No tenderness.  Abdominal:     General: Bowel sounds are normal.     Palpations: Abdomen is soft.     Tenderness: There is no abdominal tenderness. There is no guarding or rebound. Negative signs include Murphy's sign and McBurney's sign.     Hernia: No hernia is present.  Musculoskeletal: Normal range of motion.  Skin:    General: Skin is warm and dry.     Findings: No rash.     Comments: Surgical site is dressed, dressing removed and the area is clean, dry, no erythema, swelling, drainage  Neurological:     Mental Status: He is alert and oriented to person, place, and time.     GCS: GCS eye subscore is 4. GCS verbal subscore is 5. GCS motor subscore is 6.     Cranial Nerves: No cranial nerve deficit.     Sensory: No sensory deficit.     Coordination: Coordination normal.     Comments: Patient able to move both legs without difficulty.  He is able to raise both legs off the bed to 45 degrees without significant pain, normal sensation in both lower extremities.  Psychiatric:        Speech: Speech normal.        Behavior: Behavior normal.          Thought Content: Thought content normal.      ED Treatments / Results  Labs (all labs ordered are listed, but only abnormal results are displayed) Labs Reviewed - No data to display  EKG None  Radiology US Scrotum W/doppler  Result Date: 07/25/2018 CLINICAL DATA:  Acute onset RIGHT scrotal pain  that began this morning. Postop lumbar fusion on 07/16/2018. EXAM: SCROTAL ULTRASOUND DOPPLER ULTRASOUND OF THE TESTICLES TECHNIQUE: Complete ultrasound examination of the testicles, epididymis, and other scrotal structures was performed. Color and spectral Doppler ultrasound were also utilized to evaluate blood flow to the testicles. COMPARISON:  None. FINDINGS: Right testicle Measurements: Approximately 4.2 x 2.1 x 2.7 cm. Normal parenchymal echotexture without mass or microlithiasis. Normal color Doppler flow without evidence of hyperemia. Left testicle Measurements: Approximately 4.7 x 1.9 x 2.8 cm. Normal parenchymal echotexture without mass or microlithiasis. Normal color Doppler flow without evidence of hyperemia. Right epididymis: Normal in size without evidence of hyperemia. Small cysts or spermatoceles measuring approximately 9 mm and 5 mm. Left epididymis: Normal in size without evidence of hyperemia. Small cysts or spermatoceles measuring approximately 4 mm and 3 mm. Hydrocele:  Absent bilaterally. Varicocele:  Present in the RIGHT hemiscrotum.  Absent on the LEFT. Pulsed Doppler interrogation of both testes demonstrates normal low resistance arterial and venous waveforms bilaterally. IMPRESSION: 1. RIGHT-sided varicocele. 2. Otherwise normal examination. Electronically Signed   By: Evangeline Dakin M.D.   On: 07/25/2018 10:14    Procedures Procedures (including critical care time)  Medications Ordered in ED Medications  HYDROmorphone (DILAUDID) injection 1 mg (has no administration in time range)  HYDROmorphone (DILAUDID) injection 1 mg (1 mg Intravenous Given 07/27/18 0024)   ondansetron (ZOFRAN) injection 4 mg (4 mg Intravenous Given 07/27/18 0024)     Initial Impression / Assessment and Plan / ED Course  I have reviewed the triage vital signs and the nursing notes.  Pertinent labs & imaging results that were available during my care of the patient were reviewed by me and considered in my medical decision making (see chart for details).     Presents with complaints of right leg pain.  He started having some pain on the lateral aspect of the right thigh and hip area this morning and has progressively worsened through the day.  He is experiencing cramping pain in the right thigh.  This appears to be radicular in nature.  He had surgery on the L3 level and aspirin on February 6.  I did undress the wound and the surgical site appears to be healing well without any concerns.  He is not experiencing any back pain to be concerned about operative site hematoma or abscess.  He is afebrile.  He appears well.  He also has normal neurologic function.  He has normal sensation, no saddle anesthesia, normal leg raising and distal strength.  No overlying skin changes.  He has bounding peripheral pulses, both DP and PT in the right leg, no evidence of ischemia.  Patient concerned about DVT.  He has no lower leg pain, tenderness.  Calf is soft.  This is not consistent with DVT.  Ultrasound not available tonight, but I do not believe he requires one.   Patient administered Dilaudid.  Pain went from 10 to 5 out of 10.  Will be given a second dose prior to discharge.  He does not want steroids because of his diabetes.  He was told not to take NSAIDs by his surgeon for 2 months after surgery.  He does have oxycodone to use at home.  We will increase his dosing and contact his surgeon.  Patient was counseled that he needs to go to [facility] if he has any fever, back pain or numbness, tingling, weakness or significant worsening of pain in the leg.  Final Clinical Impressions(s) / ED Diagnoses  Final diagnoses:  Post-op pain  Lumbar radiculopathy    ED Discharge Orders    None       Orpah Greek, MD 07/27/18 [phone]

## 2018-11-05 DIAGNOSIS — L821 Other seborrheic keratosis: Secondary | ICD-10-CM | POA: Diagnosis not present

## 2018-11-05 DIAGNOSIS — L570 Actinic keratosis: Secondary | ICD-10-CM | POA: Diagnosis not present

## 2019-05-01 ENCOUNTER — Other Ambulatory Visit: Payer: Self-pay

## 2019-05-01 ENCOUNTER — Emergency Department (HOSPITAL_BASED_OUTPATIENT_CLINIC_OR_DEPARTMENT_OTHER)
Admission: EM | Admit: 2019-05-01 | Discharge: 2019-05-01 | Disposition: A | Discharge status: Home / Self Care | Payer: Managed Care, Other (non HMO) | Attending: Emergency Medicine | Admitting: Emergency Medicine

## 2019-05-01 ENCOUNTER — Emergency Department (HOSPITAL_BASED_OUTPATIENT_CLINIC_OR_DEPARTMENT_OTHER): Payer: Managed Care, Other (non HMO)

## 2019-05-01 ENCOUNTER — Encounter (HOSPITAL_BASED_OUTPATIENT_CLINIC_OR_DEPARTMENT_OTHER): Payer: Self-pay | Admitting: Emergency Medicine

## 2019-05-01 DIAGNOSIS — Z886 Allergy status to analgesic agent status: Secondary | ICD-10-CM | POA: Insufficient documentation

## 2019-05-01 DIAGNOSIS — E119 Type 2 diabetes mellitus without complications: Secondary | ICD-10-CM | POA: Insufficient documentation

## 2019-05-01 DIAGNOSIS — Z79899 Other long term (current) drug therapy: Secondary | ICD-10-CM | POA: Diagnosis not present

## 2019-05-01 DIAGNOSIS — Y9389 Activity, other specified: Secondary | ICD-10-CM | POA: Diagnosis not present

## 2019-05-01 DIAGNOSIS — W228XXA Striking against or struck by other objects, initial encounter: Secondary | ICD-10-CM | POA: Insufficient documentation

## 2019-05-01 DIAGNOSIS — Z87891 Personal history of nicotine dependence: Secondary | ICD-10-CM | POA: Diagnosis not present

## 2019-05-01 DIAGNOSIS — Y999 Unspecified external cause status: Secondary | ICD-10-CM | POA: Insufficient documentation

## 2019-05-01 DIAGNOSIS — S0990XA Unspecified injury of head, initial encounter: Secondary | ICD-10-CM | POA: Diagnosis not present

## 2019-05-01 DIAGNOSIS — Z7982 Long term (current) use of aspirin: Secondary | ICD-10-CM | POA: Diagnosis not present

## 2019-05-01 DIAGNOSIS — Y929 Unspecified place or not applicable: Secondary | ICD-10-CM | POA: Diagnosis not present

## 2019-05-01 DIAGNOSIS — Z888 Allergy status to other drugs, medicaments and biological substances status: Secondary | ICD-10-CM | POA: Diagnosis not present

## 2019-05-01 DIAGNOSIS — R519 Headache, unspecified: Secondary | ICD-10-CM | POA: Diagnosis not present

## 2019-05-01 DIAGNOSIS — Z794 Long term (current) use of insulin: Secondary | ICD-10-CM | POA: Diagnosis not present

## 2019-05-01 DIAGNOSIS — Z885 Allergy status to narcotic agent status: Secondary | ICD-10-CM | POA: Diagnosis not present

## 2019-05-01 NOTE — ED Triage Notes (Signed)
Pt here after a piece of metal hit him in the head. Small bump to right forehead. No LOC or changes in vision. Wife wanted to get him seen because he was dizzy afterwards.

## 2019-05-01 NOTE — Discharge Instructions (Signed)
Take Tylenol or Ibuprofen for pain as needed Rest and stay hydrated Please return if you are worsening

## 2019-05-01 NOTE — ED Notes (Signed)
Pt reports taking 1g tylenol 1 hrs pta

## 2019-05-01 NOTE — ED Provider Notes (Signed)
[facility] Provider Note   CSN: [id number] Arrival date & time: 05/01/19  1418     History   Chief Complaint Chief Complaint  Patient presents with   Head Injury    HPI Christopher Hayden is a 63 y.o. male who presents with a head injury. He states he "got his bell rung" when he was lifting a metal rod and it hit him on the head about 3 hours ago. It hit him over the top right side of the head. He reports immediate pain and dizziness. He went inside and told his wife what happened and she became worried and wanted him to come to the ED to be checked out. He states that she told him he was "weaving" when he was walking. He denies LOC, severe headache, N/V. He is not on blood thinners. He took a Tylenol PTA.   HPI  Past Medical History:  Diagnosis Date   Diabetes mellitus without complication ([place])    Kidney stone     There are no active problems to display for this patient.   Past Surgical History:  Procedure Laterality Date   BACK SURGERY     KNEE SURGERY     NECK SURGERY        Home Medications    Prior to Admission medications   Medication Sig Start Date End Date Taking? Authorizing Provider  Insulin Glargine (BASAGLAR KWIKPEN) 100 UNIT/ML SOPN Use as directed. Max daily dose is 50 units 08/05/18  Yes [provider]  amoxicillin (AMOXIL) 500 MG capsule Take 1 capsule (500 mg total) by mouth 3 (three) times daily. 06/20/16   [name], MD  aspirin (ASPIRIN EC) 81 MG EC tablet Take 81 mg by mouth at bedtime.     [provider]  ciprofloxacin (CIPRO) 500 MG tablet Take 1 tablet (500 mg total) by mouth 2 (two) times daily. One po bid x 7 days 07/20/18   Veryl Speak, MD  cyclobenzaprine (FLEXERIL) 10 MG tablet Take 10 mg by mouth every 8 (eight) hours as needed. for pain 07/18/18   [provider]  HYDROcodone-acetaminophen (NORCO/VICODIN) 5-325 MG per tablet Take 1-2 tablets by mouth every 6 (six) hours as  needed for moderate pain. Patient not taking: Reported on 06/08/2014 06/19/13   [name], [name], MD  insulin aspart (NOVOLOG) 100 UNIT/ML injection Inject 5-24 Units into the skin 3 (three) times daily with meals. On Sliding Scale    [provider]  insulin glargine (LANTUS) 100 UNIT/ML injection Inject 10-26 Units into the skin 2 (two) times daily. Takes 10 units with breakfast and 26 units at bedtime    [provider]  ondansetron (ZOFRAN ODT) 8 MG disintegrating tablet Take 1 tablet (8 mg total) by mouth every 8 (eight) hours as needed. Patient not taking: Reported on 06/08/2014 06/19/13   [name], [name], MD  oxycodone (OXY-IR) 5 MG capsule Take 5 mg by mouth every 4 (four) hours as needed.    [provider]  oxyCODONE-acetaminophen (PERCOCET/ROXICET) 5-325 MG tablet Take 1 tablet by mouth every 4 (four) hours as needed for severe pain. 06/20/16   [name], MD  pravastatin (PRAVACHOL) 80 MG tablet Take 80 mg by mouth at bedtime.    [provider]  tetrahydrozoline (VISINE) 0.05 % ophthalmic solution Place 2 drops into both eyes daily as needed (for alleriges and dry eyes).    [provider]  traMADol (ULTRAM) 50 MG tablet Take 1 tablet (50 mg total)  by mouth every 6 (six) hours as needed. 01/16/17   [name], MD    Family History History reviewed. No pertinent family history.  Social History Social History   Tobacco Use   Smoking status: Former Smoker   Smokeless tobacco: Never Used  Substance Use Topics   Alcohol use: No   Drug use: No     Allergies   Metformin and related, Meloxicam, Naproxen, Zetia [ezetimibe], and Toradol [ketorolac tromethamine]   Review of Systems Review of Systems  Eyes: Negative for visual disturbance.  Gastrointestinal: Negative for nausea and vomiting.  Neurological: Positive for dizziness and headaches. Negative for syncope.     Physical Exam Updated Vital Signs BP 135/81    Pulse 77     Temp 98 F (36.7 C) (Oral)    Resp 18    SpO2 100%   Physical Exam Vitals signs and nursing note reviewed.  Constitutional:      General: He is not in acute distress.    Appearance: Normal appearance. He is well-developed. He is not ill-appearing.     Comments: NAD. Calm, cooperative  HENT:     Head: Normocephalic.     Comments: Mild bruising/erythema over the right forehead Eyes:     General: No scleral icterus.       Right eye: No discharge.        Left eye: No discharge.     Conjunctiva/sclera: Conjunctivae normal.     Pupils: Pupils are equal, round, and reactive to light.  Neck:     Musculoskeletal: Normal range of motion.  Cardiovascular:     Rate and Rhythm: Normal rate.  Pulmonary:     Effort: Pulmonary effort is normal. No respiratory distress.  Abdominal:     General: There is no distension.  Skin:    General: Skin is warm and dry.  Neurological:     Mental Status: He is alert and oriented to person, place, and time.     Comments: Sitting in chair in NAD. GCS 15. Gives clear history. Speaks in a clear voice. Cranial nerves II through XII grossly intact. 5/5 strength in all extremities. Sensation fully intact.  Bilateral finger-to-nose intact. Ambulatory    Psychiatric:        Behavior: Behavior normal.      ED Treatments / Results  Labs (all labs ordered are listed, but only abnormal results are displayed) Labs Reviewed - No data to display  EKG None  Radiology Ct Head Wo Contrast  Result Date: 05/01/2019 CLINICAL DATA:  Right forehead injury from piece of metal. Headache. Dizziness. EXAM: CT HEAD WITHOUT CONTRAST TECHNIQUE: Contiguous axial images were obtained from the base of the skull through the vertex without intravenous contrast. COMPARISON:  06/20/2016 maxillofacial CT. FINDINGS: Brain: No evidence of parenchymal hemorrhage or extra-axial fluid collection. No mass lesion, mass effect, or midline shift. No CT evidence of acute infarction. Cerebral  volume is age appropriate. No ventriculomegaly. Vascular: No acute abnormality. Skull: No evidence of calvarial fracture. Sinuses/Orbits: The visualized paranasal sinuses are essentially clear. Other:  The mastoid air cells are unopacified. IMPRESSION: Negative head CT. No evidence of acute intracranial abnormality. No evidence of calvarial fracture. Electronically Signed   By: Ilona Sorrel M.D.   On: 05/01/2019 16:02    Procedures Procedures (including critical care time)  Medications Ordered in ED Medications - No data to display   Initial Impression / Assessment and Plan / ED Course  I have reviewed the triage vital signs and the  nursing notes.  Pertinent labs & imaging results that were available during my care of the patient were reviewed by me and considered in my medical decision making (see chart for details).  63 year old male presents with head injury after being hit by a metal rod several hours ago.  His vital signs are normal.  His neurologic exam is normal.  He has some mild bruising and erythema over the top of his head.  He had some dizziness and has been off balance after the injury.  Symptoms are slowly improving.  Will obtain CT of the head  CT is negative.  Discussed results with the patient.  Advised OTC meds as needed for pain and return if worsening.  Final Clinical Impressions(s) / ED Diagnoses   Final diagnoses:  Injury of head, initial encounter    ED Discharge Orders    None       Recardo Evangelist, PA-C 05/01/19 1630    Drenda Freeze, MD 05/02/19 1534

## 2019-06-08 DIAGNOSIS — E119 Type 2 diabetes mellitus without complications: Secondary | ICD-10-CM | POA: Diagnosis not present

## 2019-06-08 DIAGNOSIS — Z794 Long term (current) use of insulin: Secondary | ICD-10-CM | POA: Diagnosis not present

## 2019-06-08 DIAGNOSIS — Z1211 Encounter for screening for malignant neoplasm of colon: Secondary | ICD-10-CM | POA: Diagnosis not present

## 2019-06-08 DIAGNOSIS — Z6828 Body mass index (BMI) 28.0-28.9, adult: Secondary | ICD-10-CM | POA: Diagnosis not present

## 2019-06-08 DIAGNOSIS — Z0000 Encounter for general adult medical examination without abnormal findings: Secondary | ICD-10-CM | POA: Diagnosis not present

## 2019-06-08 DIAGNOSIS — Z125 Encounter for screening for malignant neoplasm of prostate: Secondary | ICD-10-CM | POA: Diagnosis not present

## 2019-06-14 DIAGNOSIS — S46011D Strain of muscle(s) and tendon(s) of the rotator cuff of right shoulder, subsequent encounter: Secondary | ICD-10-CM | POA: Diagnosis not present

## 2019-06-15 DIAGNOSIS — S46011D Strain of muscle(s) and tendon(s) of the rotator cuff of right shoulder, subsequent encounter: Secondary | ICD-10-CM | POA: Diagnosis not present

## 2019-06-15 DIAGNOSIS — M25511 Pain in right shoulder: Secondary | ICD-10-CM | POA: Diagnosis not present

## 2019-06-15 DIAGNOSIS — M6281 Muscle weakness (generalized): Secondary | ICD-10-CM | POA: Diagnosis not present

## 2019-06-17 DIAGNOSIS — M7672 Peroneal tendinitis, left leg: Secondary | ICD-10-CM | POA: Diagnosis not present

## 2019-06-17 DIAGNOSIS — E139 Other specified diabetes mellitus without complications: Secondary | ICD-10-CM | POA: Diagnosis not present

## 2019-06-17 DIAGNOSIS — M7671 Peroneal tendinitis, right leg: Secondary | ICD-10-CM | POA: Diagnosis not present

## 2019-06-21 DIAGNOSIS — M6281 Muscle weakness (generalized): Secondary | ICD-10-CM | POA: Diagnosis not present

## 2019-06-21 DIAGNOSIS — M25511 Pain in right shoulder: Secondary | ICD-10-CM | POA: Diagnosis not present

## 2019-06-21 DIAGNOSIS — S46011D Strain of muscle(s) and tendon(s) of the rotator cuff of right shoulder, subsequent encounter: Secondary | ICD-10-CM | POA: Diagnosis not present

## 2019-06-29 DIAGNOSIS — M7541 Impingement syndrome of right shoulder: Secondary | ICD-10-CM | POA: Diagnosis not present

## 2019-07-01 DIAGNOSIS — M5416 Radiculopathy, lumbar region: Secondary | ICD-10-CM | POA: Diagnosis not present

## 2019-07-01 DIAGNOSIS — Z9889 Other specified postprocedural states: Secondary | ICD-10-CM | POA: Diagnosis not present

## 2019-07-16 DIAGNOSIS — G8918 Other acute postprocedural pain: Secondary | ICD-10-CM | POA: Diagnosis not present

## 2019-07-16 DIAGNOSIS — X58XXXA Exposure to other specified factors, initial encounter: Secondary | ICD-10-CM | POA: Diagnosis not present

## 2019-07-16 DIAGNOSIS — S46011A Strain of muscle(s) and tendon(s) of the rotator cuff of right shoulder, initial encounter: Secondary | ICD-10-CM | POA: Diagnosis not present

## 2019-07-16 DIAGNOSIS — M24111 Other articular cartilage disorders, right shoulder: Secondary | ICD-10-CM | POA: Diagnosis not present

## 2019-07-16 DIAGNOSIS — M7541 Impingement syndrome of right shoulder: Secondary | ICD-10-CM | POA: Diagnosis not present

## 2019-07-16 DIAGNOSIS — S46291A Other injury of muscle, fascia and tendon of other parts of biceps, right arm, initial encounter: Secondary | ICD-10-CM | POA: Diagnosis not present

## 2019-07-16 DIAGNOSIS — Y999 Unspecified external cause status: Secondary | ICD-10-CM | POA: Diagnosis not present

## 2019-07-16 DIAGNOSIS — S43491A Other sprain of right shoulder joint, initial encounter: Secondary | ICD-10-CM | POA: Diagnosis not present

## 2019-07-16 DIAGNOSIS — M7521 Bicipital tendinitis, right shoulder: Secondary | ICD-10-CM | POA: Diagnosis not present

## 2019-07-22 DIAGNOSIS — M24111 Other articular cartilage disorders, right shoulder: Secondary | ICD-10-CM | POA: Diagnosis not present

## 2019-07-27 DIAGNOSIS — S43431D Superior glenoid labrum lesion of right shoulder, subsequent encounter: Secondary | ICD-10-CM | POA: Diagnosis not present

## 2019-07-27 DIAGNOSIS — M25511 Pain in right shoulder: Secondary | ICD-10-CM | POA: Diagnosis not present

## 2019-07-27 DIAGNOSIS — M75111 Incomplete rotator cuff tear or rupture of right shoulder, not specified as traumatic: Secondary | ICD-10-CM | POA: Diagnosis not present

## 2019-07-27 DIAGNOSIS — M6281 Muscle weakness (generalized): Secondary | ICD-10-CM | POA: Diagnosis not present

## 2019-07-30 DIAGNOSIS — M75111 Incomplete rotator cuff tear or rupture of right shoulder, not specified as traumatic: Secondary | ICD-10-CM | POA: Diagnosis not present

## 2019-07-30 DIAGNOSIS — M25511 Pain in right shoulder: Secondary | ICD-10-CM | POA: Diagnosis not present

## 2019-07-30 DIAGNOSIS — S43431D Superior glenoid labrum lesion of right shoulder, subsequent encounter: Secondary | ICD-10-CM | POA: Diagnosis not present

## 2019-07-30 DIAGNOSIS — M6281 Muscle weakness (generalized): Secondary | ICD-10-CM | POA: Diagnosis not present

## 2019-08-03 DIAGNOSIS — S43431D Superior glenoid labrum lesion of right shoulder, subsequent encounter: Secondary | ICD-10-CM | POA: Diagnosis not present

## 2019-08-03 DIAGNOSIS — M25511 Pain in right shoulder: Secondary | ICD-10-CM | POA: Diagnosis not present

## 2019-08-03 DIAGNOSIS — M6281 Muscle weakness (generalized): Secondary | ICD-10-CM | POA: Diagnosis not present

## 2019-08-03 DIAGNOSIS — M75111 Incomplete rotator cuff tear or rupture of right shoulder, not specified as traumatic: Secondary | ICD-10-CM | POA: Diagnosis not present

## 2019-08-06 DIAGNOSIS — M25511 Pain in right shoulder: Secondary | ICD-10-CM | POA: Diagnosis not present

## 2019-08-06 DIAGNOSIS — M75111 Incomplete rotator cuff tear or rupture of right shoulder, not specified as traumatic: Secondary | ICD-10-CM | POA: Diagnosis not present

## 2019-08-06 DIAGNOSIS — S43431D Superior glenoid labrum lesion of right shoulder, subsequent encounter: Secondary | ICD-10-CM | POA: Diagnosis not present

## 2019-08-06 DIAGNOSIS — M6281 Muscle weakness (generalized): Secondary | ICD-10-CM | POA: Diagnosis not present

## 2019-08-10 DIAGNOSIS — M6281 Muscle weakness (generalized): Secondary | ICD-10-CM | POA: Diagnosis not present

## 2019-08-10 DIAGNOSIS — M25511 Pain in right shoulder: Secondary | ICD-10-CM | POA: Diagnosis not present

## 2019-08-10 DIAGNOSIS — M75111 Incomplete rotator cuff tear or rupture of right shoulder, not specified as traumatic: Secondary | ICD-10-CM | POA: Diagnosis not present

## 2019-08-10 DIAGNOSIS — S43431D Superior glenoid labrum lesion of right shoulder, subsequent encounter: Secondary | ICD-10-CM | POA: Diagnosis not present

## 2019-08-13 DIAGNOSIS — M25511 Pain in right shoulder: Secondary | ICD-10-CM | POA: Diagnosis not present

## 2019-08-13 DIAGNOSIS — S43431D Superior glenoid labrum lesion of right shoulder, subsequent encounter: Secondary | ICD-10-CM | POA: Diagnosis not present

## 2019-08-13 DIAGNOSIS — M6281 Muscle weakness (generalized): Secondary | ICD-10-CM | POA: Diagnosis not present

## 2019-08-13 DIAGNOSIS — M75111 Incomplete rotator cuff tear or rupture of right shoulder, not specified as traumatic: Secondary | ICD-10-CM | POA: Diagnosis not present

## 2019-08-16 DIAGNOSIS — M6281 Muscle weakness (generalized): Secondary | ICD-10-CM | POA: Diagnosis not present

## 2019-08-16 DIAGNOSIS — M75111 Incomplete rotator cuff tear or rupture of right shoulder, not specified as traumatic: Secondary | ICD-10-CM | POA: Diagnosis not present

## 2019-08-16 DIAGNOSIS — E782 Mixed hyperlipidemia: Secondary | ICD-10-CM | POA: Diagnosis not present

## 2019-08-16 DIAGNOSIS — Z794 Long term (current) use of insulin: Secondary | ICD-10-CM | POA: Diagnosis not present

## 2019-08-16 DIAGNOSIS — S43431D Superior glenoid labrum lesion of right shoulder, subsequent encounter: Secondary | ICD-10-CM | POA: Diagnosis not present

## 2019-08-16 DIAGNOSIS — M25511 Pain in right shoulder: Secondary | ICD-10-CM | POA: Diagnosis not present

## 2019-08-16 DIAGNOSIS — E119 Type 2 diabetes mellitus without complications: Secondary | ICD-10-CM | POA: Diagnosis not present

## 2019-08-17 DIAGNOSIS — M25511 Pain in right shoulder: Secondary | ICD-10-CM | POA: Diagnosis not present

## 2019-08-20 DIAGNOSIS — M6281 Muscle weakness (generalized): Secondary | ICD-10-CM | POA: Diagnosis not present

## 2019-08-20 DIAGNOSIS — S43431D Superior glenoid labrum lesion of right shoulder, subsequent encounter: Secondary | ICD-10-CM | POA: Diagnosis not present

## 2019-08-20 DIAGNOSIS — M75111 Incomplete rotator cuff tear or rupture of right shoulder, not specified as traumatic: Secondary | ICD-10-CM | POA: Diagnosis not present

## 2019-08-20 DIAGNOSIS — M25511 Pain in right shoulder: Secondary | ICD-10-CM | POA: Diagnosis not present

## 2019-08-23 DIAGNOSIS — M6281 Muscle weakness (generalized): Secondary | ICD-10-CM | POA: Diagnosis not present

## 2019-08-23 DIAGNOSIS — S43431D Superior glenoid labrum lesion of right shoulder, subsequent encounter: Secondary | ICD-10-CM | POA: Diagnosis not present

## 2019-08-23 DIAGNOSIS — M25511 Pain in right shoulder: Secondary | ICD-10-CM | POA: Diagnosis not present

## 2019-08-23 DIAGNOSIS — M75111 Incomplete rotator cuff tear or rupture of right shoulder, not specified as traumatic: Secondary | ICD-10-CM | POA: Diagnosis not present

## 2019-08-26 DIAGNOSIS — M25511 Pain in right shoulder: Secondary | ICD-10-CM | POA: Diagnosis not present

## 2019-08-26 DIAGNOSIS — M6281 Muscle weakness (generalized): Secondary | ICD-10-CM | POA: Diagnosis not present

## 2019-08-26 DIAGNOSIS — M75111 Incomplete rotator cuff tear or rupture of right shoulder, not specified as traumatic: Secondary | ICD-10-CM | POA: Diagnosis not present

## 2019-08-26 DIAGNOSIS — S43431D Superior glenoid labrum lesion of right shoulder, subsequent encounter: Secondary | ICD-10-CM | POA: Diagnosis not present

## 2019-08-30 DIAGNOSIS — M25511 Pain in right shoulder: Secondary | ICD-10-CM | POA: Diagnosis not present

## 2019-08-30 DIAGNOSIS — M75111 Incomplete rotator cuff tear or rupture of right shoulder, not specified as traumatic: Secondary | ICD-10-CM | POA: Diagnosis not present

## 2019-08-30 DIAGNOSIS — M6281 Muscle weakness (generalized): Secondary | ICD-10-CM | POA: Diagnosis not present

## 2019-08-30 DIAGNOSIS — S43431D Superior glenoid labrum lesion of right shoulder, subsequent encounter: Secondary | ICD-10-CM | POA: Diagnosis not present

## 2019-09-02 DIAGNOSIS — S43431D Superior glenoid labrum lesion of right shoulder, subsequent encounter: Secondary | ICD-10-CM | POA: Diagnosis not present

## 2019-09-02 DIAGNOSIS — M6281 Muscle weakness (generalized): Secondary | ICD-10-CM | POA: Diagnosis not present

## 2019-09-02 DIAGNOSIS — M25511 Pain in right shoulder: Secondary | ICD-10-CM | POA: Diagnosis not present

## 2019-09-02 DIAGNOSIS — M75111 Incomplete rotator cuff tear or rupture of right shoulder, not specified as traumatic: Secondary | ICD-10-CM | POA: Diagnosis not present

## 2019-09-06 DIAGNOSIS — S43431D Superior glenoid labrum lesion of right shoulder, subsequent encounter: Secondary | ICD-10-CM | POA: Diagnosis not present

## 2019-09-06 DIAGNOSIS — M6281 Muscle weakness (generalized): Secondary | ICD-10-CM | POA: Diagnosis not present

## 2019-09-06 DIAGNOSIS — M75111 Incomplete rotator cuff tear or rupture of right shoulder, not specified as traumatic: Secondary | ICD-10-CM | POA: Diagnosis not present

## 2019-09-06 DIAGNOSIS — M25511 Pain in right shoulder: Secondary | ICD-10-CM | POA: Diagnosis not present

## 2019-09-13 DIAGNOSIS — M6281 Muscle weakness (generalized): Secondary | ICD-10-CM | POA: Diagnosis not present

## 2019-09-13 DIAGNOSIS — M75111 Incomplete rotator cuff tear or rupture of right shoulder, not specified as traumatic: Secondary | ICD-10-CM | POA: Diagnosis not present

## 2019-09-13 DIAGNOSIS — S43431D Superior glenoid labrum lesion of right shoulder, subsequent encounter: Secondary | ICD-10-CM | POA: Diagnosis not present

## 2019-09-13 DIAGNOSIS — M25511 Pain in right shoulder: Secondary | ICD-10-CM | POA: Diagnosis not present

## 2019-09-14 DIAGNOSIS — M25511 Pain in right shoulder: Secondary | ICD-10-CM | POA: Diagnosis not present

## 2019-09-16 DIAGNOSIS — M75111 Incomplete rotator cuff tear or rupture of right shoulder, not specified as traumatic: Secondary | ICD-10-CM | POA: Diagnosis not present

## 2019-09-16 DIAGNOSIS — S43431D Superior glenoid labrum lesion of right shoulder, subsequent encounter: Secondary | ICD-10-CM | POA: Diagnosis not present

## 2019-09-16 DIAGNOSIS — M6281 Muscle weakness (generalized): Secondary | ICD-10-CM | POA: Diagnosis not present

## 2019-09-16 DIAGNOSIS — M25511 Pain in right shoulder: Secondary | ICD-10-CM | POA: Diagnosis not present

## 2019-10-07 DIAGNOSIS — Z794 Long term (current) use of insulin: Secondary | ICD-10-CM | POA: Diagnosis not present

## 2019-10-07 DIAGNOSIS — E782 Mixed hyperlipidemia: Secondary | ICD-10-CM | POA: Diagnosis not present

## 2019-10-07 DIAGNOSIS — E119 Type 2 diabetes mellitus without complications: Secondary | ICD-10-CM | POA: Diagnosis not present

## 2019-11-02 DIAGNOSIS — Z9889 Other specified postprocedural states: Secondary | ICD-10-CM | POA: Diagnosis not present

## 2019-11-02 DIAGNOSIS — M545 Low back pain: Secondary | ICD-10-CM | POA: Diagnosis not present

## 2019-11-17 DIAGNOSIS — Z794 Long term (current) use of insulin: Secondary | ICD-10-CM | POA: Diagnosis not present

## 2019-11-17 DIAGNOSIS — E119 Type 2 diabetes mellitus without complications: Secondary | ICD-10-CM | POA: Diagnosis not present

## 2019-11-17 DIAGNOSIS — E782 Mixed hyperlipidemia: Secondary | ICD-10-CM | POA: Diagnosis not present

## 2019-12-24 DIAGNOSIS — M5127 Other intervertebral disc displacement, lumbosacral region: Secondary | ICD-10-CM | POA: Diagnosis not present

## 2019-12-24 DIAGNOSIS — M48061 Spinal stenosis, lumbar region without neurogenic claudication: Secondary | ICD-10-CM | POA: Diagnosis not present

## 2019-12-24 DIAGNOSIS — M545 Low back pain: Secondary | ICD-10-CM | POA: Diagnosis not present

## 2019-12-24 DIAGNOSIS — M5126 Other intervertebral disc displacement, lumbar region: Secondary | ICD-10-CM | POA: Diagnosis not present

## 2019-12-24 DIAGNOSIS — M4316 Spondylolisthesis, lumbar region: Secondary | ICD-10-CM | POA: Diagnosis not present

## 2020-01-08 ENCOUNTER — Other Ambulatory Visit: Payer: Self-pay

## 2020-01-08 ENCOUNTER — Emergency Department (HOSPITAL_BASED_OUTPATIENT_CLINIC_OR_DEPARTMENT_OTHER)
Admission: EM | Admit: 2020-01-08 | Discharge: 2020-01-08 | Disposition: A | Discharge status: Home / Self Care | Payer: Medicare Other | Attending: Emergency Medicine | Admitting: Emergency Medicine

## 2020-01-08 ENCOUNTER — Encounter (HOSPITAL_BASED_OUTPATIENT_CLINIC_OR_DEPARTMENT_OTHER): Payer: Self-pay | Admitting: Emergency Medicine

## 2020-01-08 DIAGNOSIS — H6692 Otitis media, unspecified, left ear: Secondary | ICD-10-CM | POA: Diagnosis not present

## 2020-01-08 DIAGNOSIS — Z7982 Long term (current) use of aspirin: Secondary | ICD-10-CM | POA: Diagnosis not present

## 2020-01-08 DIAGNOSIS — R9431 Abnormal electrocardiogram [ECG] [EKG]: Secondary | ICD-10-CM | POA: Diagnosis not present

## 2020-01-08 DIAGNOSIS — H6690 Otitis media, unspecified, unspecified ear: Secondary | ICD-10-CM

## 2020-01-08 DIAGNOSIS — Z794 Long term (current) use of insulin: Secondary | ICD-10-CM | POA: Insufficient documentation

## 2020-01-08 DIAGNOSIS — H9202 Otalgia, left ear: Secondary | ICD-10-CM

## 2020-01-08 DIAGNOSIS — M542 Cervicalgia: Secondary | ICD-10-CM | POA: Insufficient documentation

## 2020-01-08 DIAGNOSIS — Z87891 Personal history of nicotine dependence: Secondary | ICD-10-CM | POA: Insufficient documentation

## 2020-01-08 DIAGNOSIS — E119 Type 2 diabetes mellitus without complications: Secondary | ICD-10-CM | POA: Insufficient documentation

## 2020-01-08 HISTORY — DX: Pure hypercholesterolemia, unspecified: E78.00

## 2020-01-08 MED ORDER — AMOXICILLIN 500 MG PO CAPS: 1000.0000 mg | ORAL_CAPSULE | Freq: Two times a day (BID) | ORAL | 0 refills | Status: DC

## 2020-01-08 MED ORDER — CETIRIZINE HCL 10 MG PO TABS: 10.0000 mg | ORAL_TABLET | Freq: Every day | ORAL | 0 refills | Status: DC

## 2020-01-08 NOTE — ED Provider Notes (Signed)
[facility] [place] [facility] EMERGENCY DEPARTMENT Provider Note   CSN: [id number] Arrival date & time: 01/08/20  0551     History Chief Complaint  Patient presents with  . Otalgia    Christopher Hayden is a 64 y.o. male.  Patient presents to the ER for evaluation of ear pain.  He reports that he started to notice itching followed by pain in the left ear yesterday.  Since then he has started to notice some pain radiating to the left side of his head and the left side of his neck.  He has not seen any rash.  He has not had any injury.  There is no hearing loss.  Patient reports that his wife was concerned that this pain might represent a heart attack so convinced him to come to the ER.  He has not experiencing any chest pain, shortness of breath.        Past Medical History:  Diagnosis Date  . Diabetes mellitus without complication ([place])   . High cholesterol   . Kidney stone     There are no problems to display for this patient.   Past Surgical History:  Procedure Laterality Date  . BACK SURGERY    . KNEE SURGERY    . NECK SURGERY         No family history on file.  Social History   Tobacco Use  . Smoking status: Former Research scientist (life sciences)  . Smokeless tobacco: Never Used  Vaping Use  . Vaping Use: Never used  Substance Use Topics  . Alcohol use: No  . Drug use: No    Home Medications Prior to Admission medications   Medication Sig Start Date End Date Taking? Authorizing Provider  amoxicillin (AMOXIL) 500 MG capsule Take 2 capsules (1,000 mg total) by mouth 2 (two) times daily. 01/08/20   Orpah Greek, MD  aspirin (ASPIRIN EC) 81 MG EC tablet Take 81 mg by mouth at bedtime.     [provider]  cetirizine (ZYRTEC ALLERGY) 10 MG tablet Take 1 tablet (10 mg total) by mouth daily. 01/08/20   Orpah Greek, MD  ciprofloxacin (CIPRO) 500 MG tablet Take 1 tablet (500 mg total) by mouth 2 (two) times daily. One po bid x 7 days 07/20/18   Veryl Speak, MD    cyclobenzaprine (FLEXERIL) 10 MG tablet Take 10 mg by mouth every 8 (eight) hours as needed. for pain 07/18/18   [provider]  HYDROcodone-acetaminophen (NORCO/VICODIN) 5-325 MG per tablet Take 1-2 tablets by mouth every 6 (six) hours as needed for moderate pain. Patient not taking: Reported on 06/08/2014 06/19/13   [name], [name], MD  insulin aspart (NOVOLOG) 100 UNIT/ML injection Inject 5-24 Units into the skin 3 (three) times daily with meals. On Sliding Scale    [provider]  Insulin Glargine (BASAGLAR KWIKPEN) 100 UNIT/ML SOPN Use as directed. Max daily dose is 50 units 08/05/18   [provider]  insulin glargine (LANTUS) 100 UNIT/ML injection Inject 10-26 Units into the skin 2 (two) times daily. Takes 10 units with breakfast and 26 units at bedtime    [provider]  ondansetron (ZOFRAN ODT) 8 MG disintegrating tablet Take 1 tablet (8 mg total) by mouth every 8 (eight) hours as needed. Patient not taking: Reported on 06/08/2014 06/19/13   [name], [name], MD  oxycodone (OXY-IR) 5 MG capsule Take 5 mg by mouth every 4 (four) hours as needed.    [provider]  oxyCODONE-acetaminophen (PERCOCET/ROXICET) 5-325 MG  tablet Take 1 tablet by mouth every 4 (four) hours as needed for severe pain. 06/20/16   [name], MD  pravastatin (PRAVACHOL) 80 MG tablet Take 80 mg by mouth at bedtime.    [provider]  tetrahydrozoline (VISINE) 0.05 % ophthalmic solution Place 2 drops into both eyes daily as needed (for alleriges and dry eyes).    [provider]  traMADol (ULTRAM) 50 MG tablet Take 1 tablet (50 mg total) by mouth every 6 (six) hours as needed. 01/16/17   [name], MD    Allergies    Metformin and related, Meloxicam, Naproxen, Zetia [ezetimibe], and Toradol [ketorolac tromethamine]  Review of Systems   Review of Systems  HENT: Positive for ear pain.   Skin: Negative for rash.  All other systems reviewed and are  negative.   Physical Exam Updated Vital Signs BP (!) 136/76 (BP Location: Left Arm)   Pulse 59   Temp 98.9 F (37.2 C) (Oral)   Resp 16   Ht 5\' 11"  (1.803 m)   Wt 89.8 kg   SpO2 98%   BMI 27.62 kg/m   Physical Exam Vitals and nursing note reviewed.  Constitutional:      General: He is not in acute distress.    Appearance: Normal appearance. He is well-developed.  HENT:     Head: Normocephalic and atraumatic.     Right Ear: Hearing normal.     Left Ear: Hearing normal. A middle ear effusion is present. Tympanic membrane is not perforated, erythematous or bulging.     Ears:      Comments: No mastoid tenderness, swelling or bogginess    Nose: Nose normal.  Eyes:     Conjunctiva/sclera: Conjunctivae normal.     Pupils: Pupils are equal, round, and reactive to light.  Cardiovascular:     Rate and Rhythm: Regular rhythm.     Heart sounds: S1 normal and S2 normal. No murmur heard.  No friction rub. No gallop.   Pulmonary:     Effort: Pulmonary effort is normal. No respiratory distress.     Breath sounds: Normal breath sounds.  Chest:     Chest wall: No tenderness.  Abdominal:     General: Bowel sounds are normal.     Palpations: Abdomen is soft.     Tenderness: There is no abdominal tenderness. There is no guarding or rebound. Negative signs include Murphy's sign and McBurney's sign.     Hernia: No hernia is present.  Musculoskeletal:        General: Normal range of motion.     Cervical back: Normal range of motion and neck supple.  Skin:    General: Skin is warm and dry.     Findings: No rash.  Neurological:     Mental Status: He is alert and oriented to person, place, and time.     GCS: GCS eye subscore is 4. GCS verbal subscore is 5. GCS motor subscore is 6.     Cranial Nerves: No cranial nerve deficit.     Sensory: No sensory deficit.     Coordination: Coordination normal.  Psychiatric:        Speech: Speech normal.        Behavior: Behavior normal.         Thought Content: Thought content normal.     ED Results / Procedures / Treatments   Labs (all labs ordered are listed, but only abnormal results are displayed) Labs Reviewed - No data to display  EKG EKG Interpretation  Date/Time:  Saturday January 08 2020 06:39:47 EDT Ventricular Rate:  58 PR Interval:    QRS Duration: 100 QT Interval:  441 QTC Calculation: 434 R Axis:   14 Text Interpretation: Sinus rhythm Normal ECG Confirmed by Orpah Greek [phone]) on 01/08/2020 6:41:41 AM   Radiology No results found.  Procedures Procedures (including critical care time)  Medications Ordered in ED Medications - No data to display  ED Course  I have reviewed the triage vital signs and the nursing notes.  Pertinent labs & imaging results that were available during my care of the patient were reviewed by me and considered in my medical decision making (see chart for details).    MDM Rules/Calculators/A&P                          Patient presents to the emergency department for evaluation of left ear pain with some radiation to the left parietal area and left lateral neck.  There is no overlying rash or sign of zoster.  Ear canal is normal.  No erythema of the tympanic membrane but there is an effusion present.  This might be the cause of the patient's symptoms.  He is concerned about cardiac etiology.  He is not experiencing any cardiac symptoms.  EKG is reassuring. Final Clinical Impression(s) / ED Diagnoses Final diagnoses:  Otalgia of left ear  Acute otitis media, unspecified otitis media type    Rx / DC Orders ED Discharge Orders         Ordered    cetirizine (ZYRTEC ALLERGY) 10 MG tablet  Daily     Discontinue  Reprint     01/08/20 0646    amoxicillin (AMOXIL) 500 MG capsule  2 times daily     Discontinue  Reprint     01/08/20 0646           Orpah Greek, MD 01/08/20 [phone]

## 2020-01-08 NOTE — ED Triage Notes (Signed)
L ear pain, radiating into head and neck, since yesterday.

## 2020-01-11 DIAGNOSIS — M5416 Radiculopathy, lumbar region: Secondary | ICD-10-CM | POA: Diagnosis not present

## 2020-01-11 DIAGNOSIS — M545 Low back pain: Secondary | ICD-10-CM | POA: Diagnosis not present

## 2020-01-11 DIAGNOSIS — M5136 Other intervertebral disc degeneration, lumbar region: Secondary | ICD-10-CM | POA: Diagnosis not present

## 2020-01-11 DIAGNOSIS — Z9889 Other specified postprocedural states: Secondary | ICD-10-CM | POA: Diagnosis not present

## 2020-01-17 DIAGNOSIS — H6592 Unspecified nonsuppurative otitis media, left ear: Secondary | ICD-10-CM | POA: Diagnosis not present

## 2020-03-02 DIAGNOSIS — M5126 Other intervertebral disc displacement, lumbar region: Secondary | ICD-10-CM | POA: Diagnosis not present

## 2020-03-02 DIAGNOSIS — Z981 Arthrodesis status: Secondary | ICD-10-CM | POA: Diagnosis not present

## 2020-03-02 DIAGNOSIS — M545 Low back pain: Secondary | ICD-10-CM | POA: Diagnosis not present

## 2020-03-02 DIAGNOSIS — M5416 Radiculopathy, lumbar region: Secondary | ICD-10-CM | POA: Diagnosis not present

## 2020-03-07 DIAGNOSIS — M5416 Radiculopathy, lumbar region: Secondary | ICD-10-CM | POA: Diagnosis not present

## 2020-03-07 DIAGNOSIS — Z9889 Other specified postprocedural states: Secondary | ICD-10-CM | POA: Diagnosis not present

## 2020-03-09 DIAGNOSIS — E782 Mixed hyperlipidemia: Secondary | ICD-10-CM | POA: Diagnosis not present

## 2020-03-09 DIAGNOSIS — Z794 Long term (current) use of insulin: Secondary | ICD-10-CM | POA: Diagnosis not present

## 2020-03-09 DIAGNOSIS — E119 Type 2 diabetes mellitus without complications: Secondary | ICD-10-CM | POA: Diagnosis not present

## 2020-04-09 IMAGING — CT CT HEAD W/O CM
3 series · 14 of 47 positions shown, 16 images · non-contrast
Comparison: 06/20/2016 maxillofacial CT.

CLINICAL DATA: Right forehead injury from piece of metal. Headache.
Dizziness.

EXAM:
CT HEAD WITHOUT CONTRAST
TECHNIQUE: Contiguous axial images were obtained from the base of the skull
through the vertex without intravenous contrast.

[Series 2: head wo · axial · 0.49mm/px · z∈[-164,-39]mm · 8 of 31 slices shown, 10 images]
[im 3/31  brain]
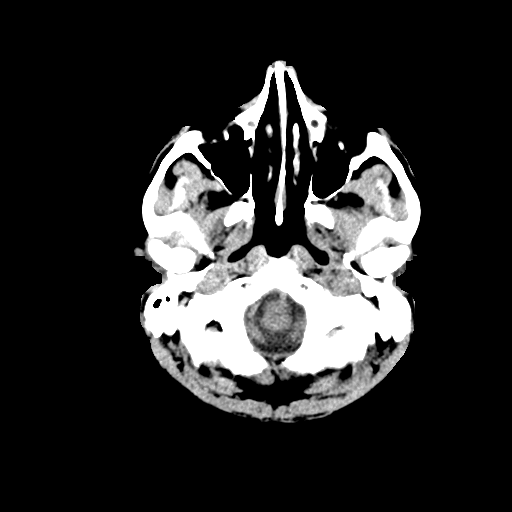
[im 3/31  bone]
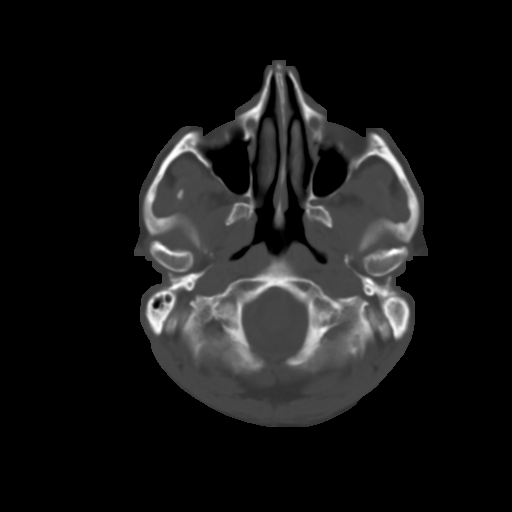
[im 7/31  brain]
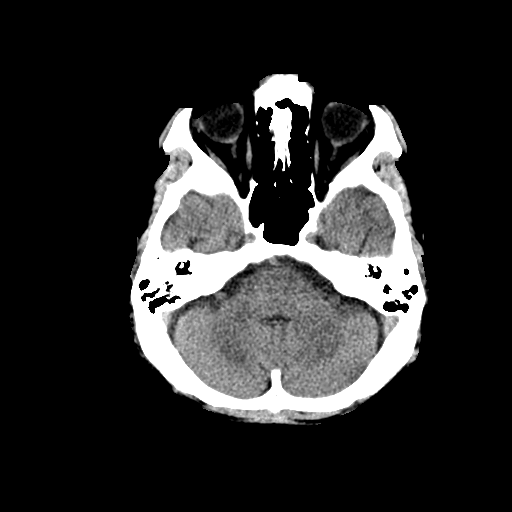
[im 10/31  brain]
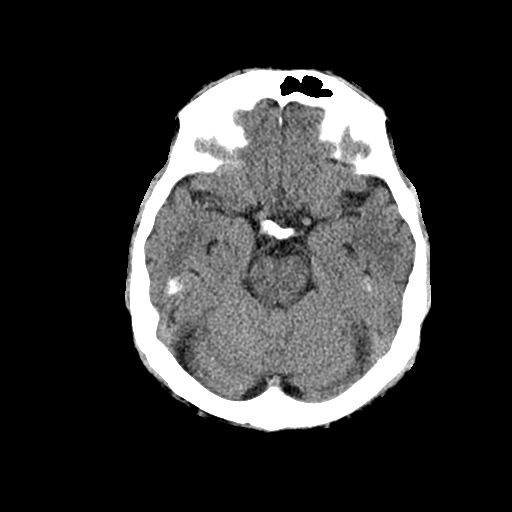
[im 14/31  brain]
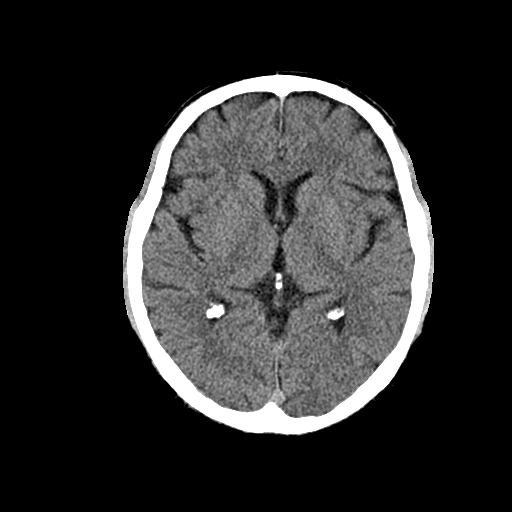
[im 17/31  brain]
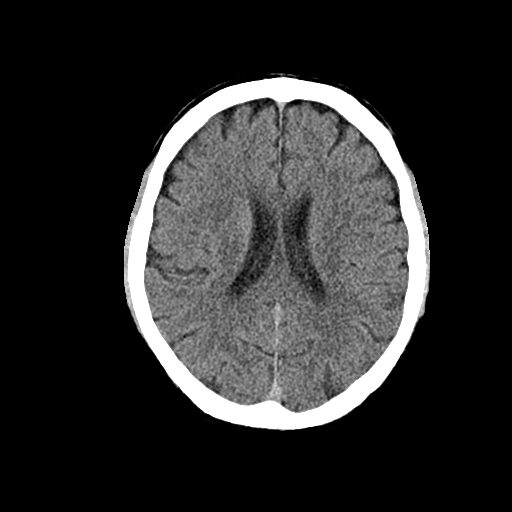
[im 17/31  bone]
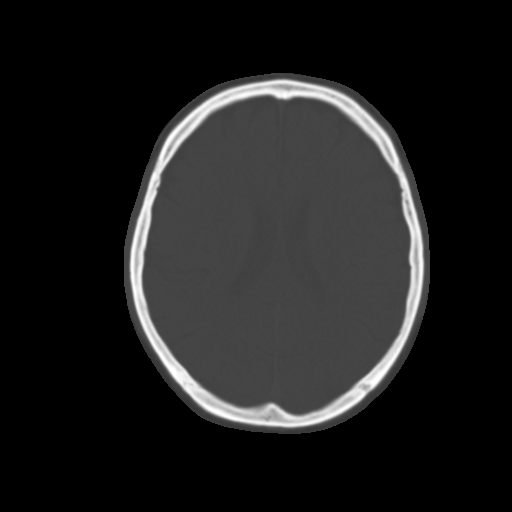
[im 21/31  brain]
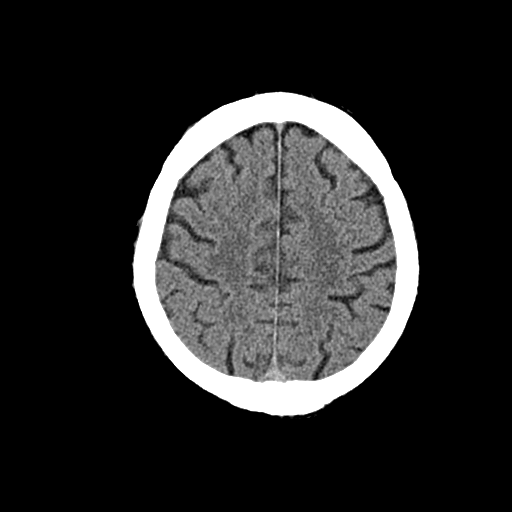
[im 24/31  brain]
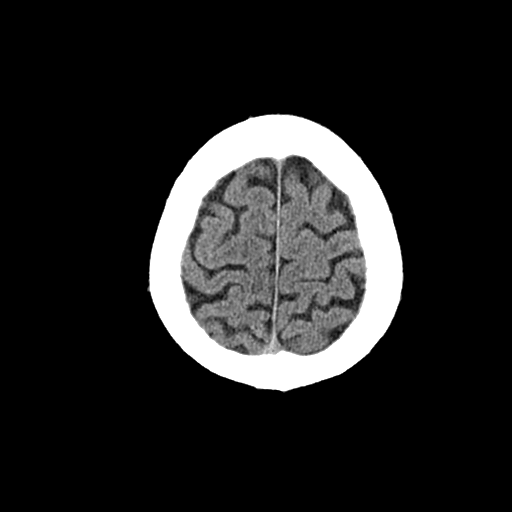
[im 28/31  brain]
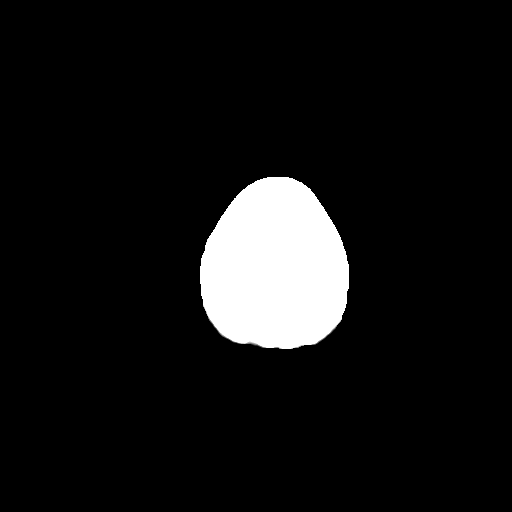

[Series 4: cor soft · coronal · 0.33mm/px · 3 of 84 slices shown]
[im 28/84  brain]
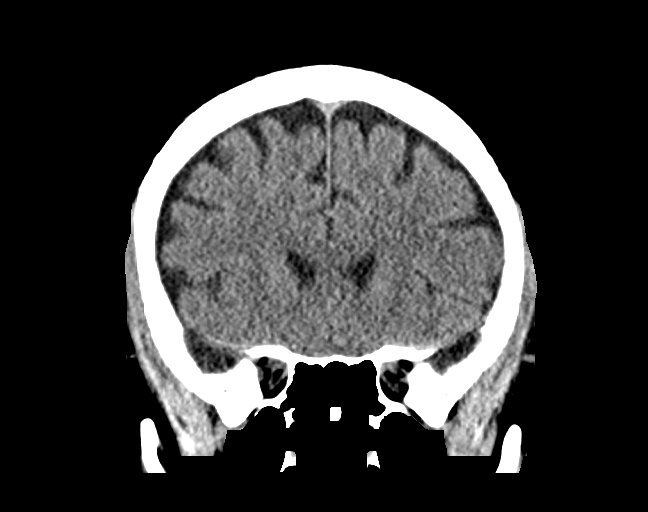
[im 37/84  brain]
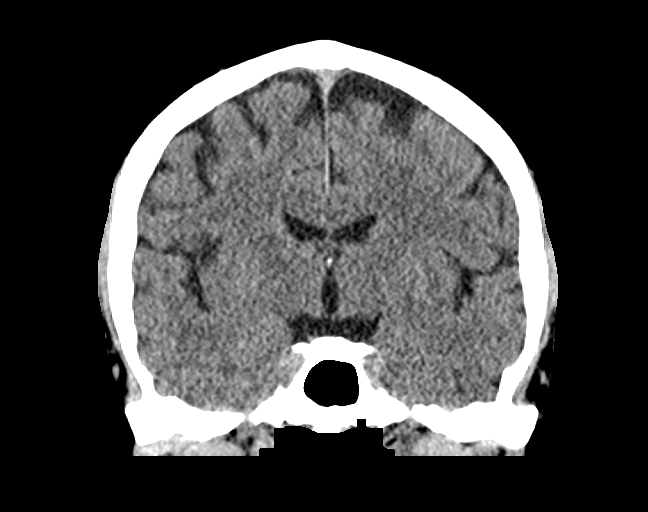
[im 47/84  brain]
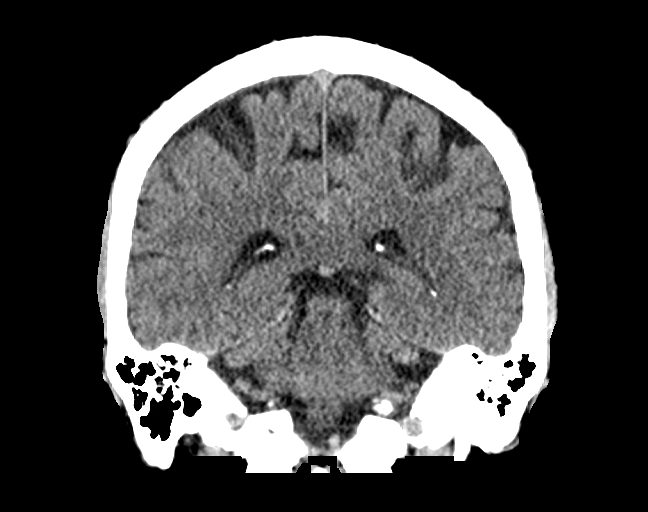

[Series 5: sag soft · sagittal · 0.30mm/px · 3 of 61 slices shown]
[im 21/61  brain]
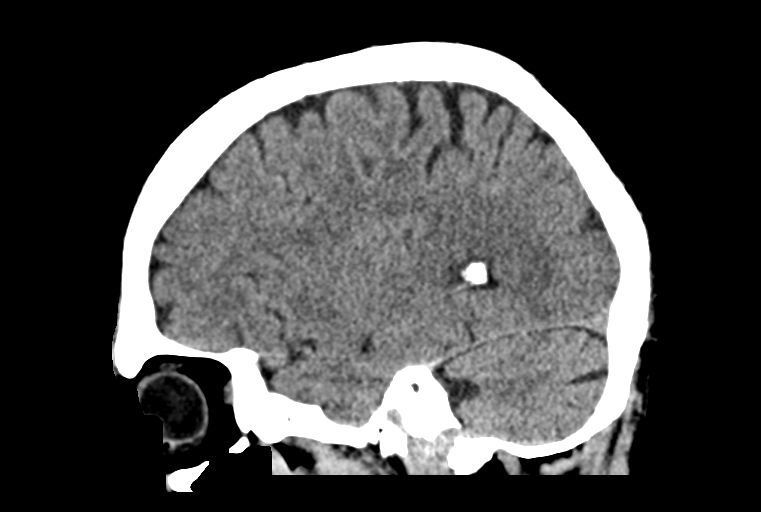
[im 31/61  brain]
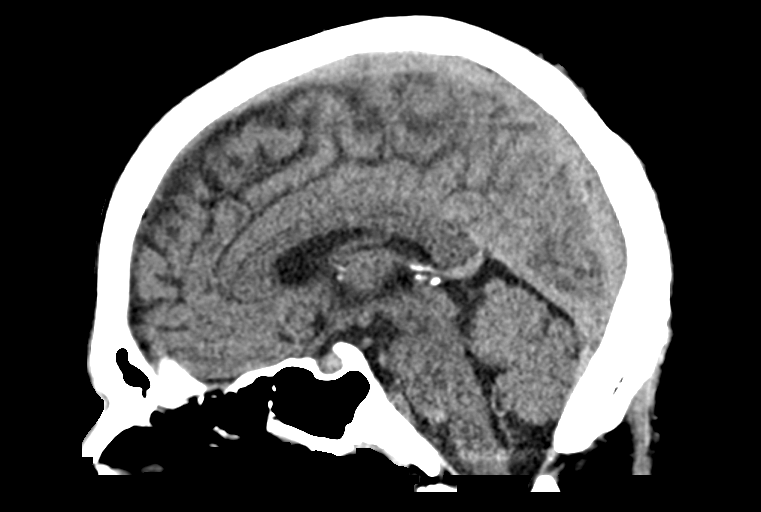
[im 41/61  brain]
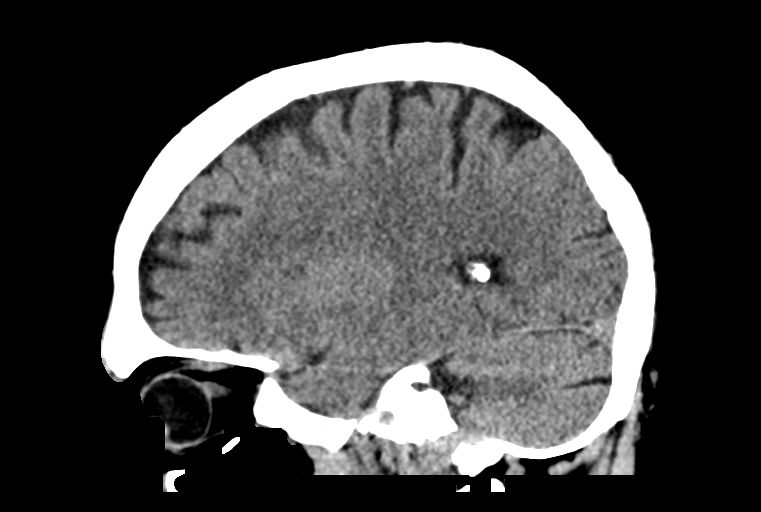

[14 of 47 positions shown; findings below may reference images not displayed]

FINDINGS: Brain: No evidence of parenchymal hemorrhage or extra-axial fluid
collection. No mass lesion, mass effect, or midline shift. No CT
evidence of acute infarction. Cerebral volume is age appropriate. No
ventriculomegaly.

Vascular: No acute abnormality.

Skull: No evidence of calvarial fracture.

Sinuses/Orbits: The visualized paranasal sinuses are essentially
clear.

Other:  The mastoid air cells are unopacified.
IMPRESSION: Negative head CT. No evidence of acute intracranial abnormality. No
evidence of calvarial fracture.

## 2020-04-27 DIAGNOSIS — M545 Low back pain, unspecified: Secondary | ICD-10-CM | POA: Diagnosis not present

## 2020-04-27 DIAGNOSIS — M5126 Other intervertebral disc displacement, lumbar region: Secondary | ICD-10-CM | POA: Diagnosis not present

## 2020-05-01 DIAGNOSIS — M545 Low back pain, unspecified: Secondary | ICD-10-CM | POA: Diagnosis not present

## 2020-05-01 DIAGNOSIS — M5126 Other intervertebral disc displacement, lumbar region: Secondary | ICD-10-CM | POA: Diagnosis not present

## 2020-05-03 DIAGNOSIS — M545 Low back pain, unspecified: Secondary | ICD-10-CM | POA: Diagnosis not present

## 2020-05-03 DIAGNOSIS — M5126 Other intervertebral disc displacement, lumbar region: Secondary | ICD-10-CM | POA: Diagnosis not present

## 2020-05-11 DIAGNOSIS — M545 Low back pain, unspecified: Secondary | ICD-10-CM | POA: Diagnosis not present

## 2020-05-11 DIAGNOSIS — M5126 Other intervertebral disc displacement, lumbar region: Secondary | ICD-10-CM | POA: Diagnosis not present

## 2020-05-15 DIAGNOSIS — M545 Low back pain, unspecified: Secondary | ICD-10-CM | POA: Diagnosis not present

## 2020-05-15 DIAGNOSIS — M5126 Other intervertebral disc displacement, lumbar region: Secondary | ICD-10-CM | POA: Diagnosis not present

## 2020-05-18 DIAGNOSIS — M545 Low back pain, unspecified: Secondary | ICD-10-CM | POA: Diagnosis not present

## 2020-05-18 DIAGNOSIS — M5126 Other intervertebral disc displacement, lumbar region: Secondary | ICD-10-CM | POA: Diagnosis not present

## 2020-05-22 DIAGNOSIS — M5126 Other intervertebral disc displacement, lumbar region: Secondary | ICD-10-CM | POA: Diagnosis not present

## 2020-05-22 DIAGNOSIS — M545 Low back pain, unspecified: Secondary | ICD-10-CM | POA: Diagnosis not present

## 2020-05-25 DIAGNOSIS — M545 Low back pain, unspecified: Secondary | ICD-10-CM | POA: Diagnosis not present

## 2020-05-25 DIAGNOSIS — M5126 Other intervertebral disc displacement, lumbar region: Secondary | ICD-10-CM | POA: Diagnosis not present

## 2020-05-29 DIAGNOSIS — M545 Low back pain, unspecified: Secondary | ICD-10-CM | POA: Diagnosis not present

## 2020-05-29 DIAGNOSIS — M5126 Other intervertebral disc displacement, lumbar region: Secondary | ICD-10-CM | POA: Diagnosis not present

## 2020-05-31 DIAGNOSIS — M545 Low back pain, unspecified: Secondary | ICD-10-CM | POA: Diagnosis not present

## 2020-05-31 DIAGNOSIS — M5126 Other intervertebral disc displacement, lumbar region: Secondary | ICD-10-CM | POA: Diagnosis not present

## 2020-06-06 DIAGNOSIS — M5416 Radiculopathy, lumbar region: Secondary | ICD-10-CM | POA: Diagnosis not present

## 2020-06-06 DIAGNOSIS — M5136 Other intervertebral disc degeneration, lumbar region: Secondary | ICD-10-CM | POA: Diagnosis not present

## 2020-06-06 DIAGNOSIS — Z9889 Other specified postprocedural states: Secondary | ICD-10-CM | POA: Diagnosis not present

## 2020-06-08 DIAGNOSIS — Z636 Dependent relative needing care at home: Secondary | ICD-10-CM | POA: Diagnosis not present

## 2020-06-08 DIAGNOSIS — E782 Mixed hyperlipidemia: Secondary | ICD-10-CM | POA: Diagnosis not present

## 2020-06-08 DIAGNOSIS — Z0000 Encounter for general adult medical examination without abnormal findings: Secondary | ICD-10-CM | POA: Diagnosis not present

## 2020-06-08 DIAGNOSIS — Z23 Encounter for immunization: Secondary | ICD-10-CM | POA: Diagnosis not present

## 2020-10-27 ENCOUNTER — Emergency Department (HOSPITAL_COMMUNITY): Payer: Medicare Other

## 2020-10-27 ENCOUNTER — Emergency Department (HOSPITAL_COMMUNITY)
Admission: EM | Admit: 2020-10-27 | Discharge: 2020-10-28 | Disposition: A | Discharge status: Home / Self Care | Payer: Medicare Other | Attending: Emergency Medicine | Admitting: Emergency Medicine

## 2020-10-27 ENCOUNTER — Other Ambulatory Visit: Payer: Self-pay

## 2020-10-27 ENCOUNTER — Encounter (HOSPITAL_COMMUNITY): Payer: Self-pay

## 2020-10-27 DIAGNOSIS — E119 Type 2 diabetes mellitus without complications: Secondary | ICD-10-CM | POA: Insufficient documentation

## 2020-10-27 DIAGNOSIS — U071 COVID-19: Secondary | ICD-10-CM | POA: Insufficient documentation

## 2020-10-27 DIAGNOSIS — R0981 Nasal congestion: Secondary | ICD-10-CM | POA: Diagnosis present

## 2020-10-27 DIAGNOSIS — Z87891 Personal history of nicotine dependence: Secondary | ICD-10-CM | POA: Diagnosis not present

## 2020-10-27 DIAGNOSIS — R079 Chest pain, unspecified: Secondary | ICD-10-CM

## 2020-10-27 LAB — CBC WITH DIFFERENTIAL/PLATELET
Abs Immature Granulocytes: 0.03 10*3/uL (ref 0.00–0.07)
Basophils Absolute: 0 10*3/uL (ref 0.0–0.1)
Basophils Relative: 0 %
Eosinophils Absolute: 0 10*3/uL (ref 0.0–0.5)
Eosinophils Relative: 0 %
HCT: 42.4 % (ref 39.0–52.0)
Hemoglobin: 14.1 g/dL (ref 13.0–17.0)
Immature Granulocytes: 1 %
Lymphocytes Relative: 9 %
Lymphs Abs: 0.5 10*3/uL — ABNORMAL LOW (ref 0.7–4.0)
MCH: 30.9 pg (ref 26.0–34.0)
MCHC: 33.3 g/dL (ref 30.0–36.0)
MCV: 93 fL (ref 80.0–100.0)
Monocytes Absolute: 0.6 10*3/uL (ref 0.1–1.0)
Monocytes Relative: 10 %
Neutro Abs: 4.4 10*3/uL (ref 1.7–7.7)
Neutrophils Relative %: 80 %
Platelets: 221 10*3/uL (ref 150–400)
RBC: 4.56 MIL/uL (ref 4.22–5.81)
RDW: 12.5 % (ref 11.5–15.5)
WBC: 5.6 10*3/uL (ref 4.0–10.5)
nRBC: 0 % (ref 0.0–0.2)

## 2020-10-27 NOTE — ED Triage Notes (Addendum)
Patient BIB [facility], reports chest pain with fever, nasal congestion   EMS gave 600cc NS, 600mg  Motrin for fever

## 2020-10-27 NOTE — ED Provider Notes (Signed)
  Emergency Medicine Provider in Triage Note   MSE was initiated and I personally evaluated the patient  10:23 PM on Oct 27, 2020 as provider in triage.   Chief Complaint: chest discomfort  HPI  Patient is a 65 y.o. who presents to the ED with complaints of chest discomfort since last night, intermittent tightness, no alleviating/aggravating factors, associated sxs include post nasal drip, scratchy throat, and some dyspnea. States he has been under a lot of stress since his wife died, hyperventilates at times.     Review of Systems  Positive: Dyspnea, chest tightness, post nasal drip, sore throat Negative: Syncope, leg pain/swelling, hemoptysis  Physical Exam  BP 130/74 (BP Location: Right Arm)   Pulse 98   Temp 100.2 F (37.9 C) (Oral)   Resp 19   Ht 5\' 11"  (1.803 m)   Wt 90 kg   SpO2 96%   BMI 27.67 kg/m    Gen:   Awake, no distress   HEENT:  Atraumatic  Resp:  Normal effort  Cardiac:  Normal rate Abd:   Nondistended, nontender  MSK:   Moves extremities without difficulty  Neuro:  Speech clear   Medical Decision Making   Initiation of care has begun. The patient has been counseled on the process, plan, and necessity for staying for the completion/evaluation, informed that the remainder of the evaluation will be completed by another provider, this initial triage assessment does not replace that evaluation, and the importance of remaining in the ED until their evaluation is complete.  Chest pain work up and covid test ordered.   Clinical Impression  Chest pain.          Leafy Kindle 10/27/20 2224    Merrily Pew, MD 10/27/20 [phone]

## 2020-10-28 DIAGNOSIS — U071 COVID-19: Secondary | ICD-10-CM | POA: Diagnosis not present

## 2020-10-28 LAB — RESP PANEL BY RT-PCR (FLU A&B, COVID) ARPGX2
Influenza A by PCR: NEGATIVE
Influenza B by PCR: NEGATIVE
SARS Coronavirus 2 by RT PCR: POSITIVE — AB

## 2020-10-28 LAB — BASIC METABOLIC PANEL
Anion gap: 10 (ref 5–15)
BUN: 17 mg/dL (ref 8–23)
CO2: 21 mmol/L — ABNORMAL LOW (ref 22–32)
Calcium: 8.6 mg/dL — ABNORMAL LOW (ref 8.9–10.3)
Chloride: 102 mmol/L (ref 98–111)
Creatinine, Ser: 1.07 mg/dL (ref 0.61–1.24)
GFR, Estimated: >60 mL/min (ref >60)
Glucose, Bld: 270 mg/dL — ABNORMAL HIGH (ref 70–99)
Potassium: 4 mmol/L (ref 3.5–5.1)
Sodium: 133 mmol/L — ABNORMAL LOW (ref 135–145)

## 2020-10-28 LAB — TROPONIN I (HIGH SENSITIVITY)
Troponin I (High Sensitivity): 12 ng/L (ref <18)
Troponin I (High Sensitivity): 12 ng/L (ref <18)

## 2020-10-28 NOTE — Discharge Instructions (Addendum)
You were evaluated in the Emergency Department and after careful evaluation, we did not find any emergent condition requiring admission or further testing in the hospital.  Your exam/testing today was overall reassuring.  Symptoms seem to be due to COVID-19.  You have tested positive here in the emergency department.  As discussed, you will need to isolate at home for the next 5 days per [facility] recommendations.  Recommend Tylenol or Motrin for discomfort, plenty of fluids.  The infusion center may call you for outpatient antibiotic therapy.  Please return to the Emergency Department if you experience any worsening of your condition.  Thank you for allowing [place] to be a part of your care.

## 2020-10-28 NOTE — ED Provider Notes (Signed)
[facility] Emergency Department Provider Note MRN:  [id number]  Arrival date & time: 10/28/20     Chief Complaint   Chest Pain and Nasal Congestion   History of Present Illness   Christopher Hayden is a 65 y.o. year-old male with a history of diabetes presenting to the ED with chief complaint of chest pain and nasal congestion.  Patient has been experiencing headache, body aches, nasal congestion, chest tightness, shortness of breath.  Symptoms for the past 1 to 2 days, mild to moderate in severity, constant.  Shortness of breath is better at this time.  Denies fever, minimal cough, no abdominal pain, no numbness or weakness to the arms or legs.  Review of Systems  A complete 10 system review of systems was obtained and all systems are negative except as noted in the HPI and PMH.   Patient's Health History    Past Medical History:  Diagnosis Date  . Diabetes mellitus without complication ([place])   . High cholesterol   . Kidney stone     Past Surgical History:  Procedure Laterality Date  . BACK SURGERY    . KNEE SURGERY    . NECK SURGERY      History reviewed. No pertinent family history.  Social History   Socioeconomic History  . Marital status: Married    Spouse name: Not on file  . Number of children: Not on file  . Years of education: Not on file  . Highest education level: Not on file  Occupational History  . Not on file  Tobacco Use  . Smoking status: Former Research scientist (life sciences)  . Smokeless tobacco: Never Used  Vaping Use  . Vaping Use: Never used  Substance and Sexual Activity  . Alcohol use: No  . Drug use: No  . Sexual activity: Not on file  Other Topics Concern  . Not on file  Social History Narrative  . Not on file   Social Determinants of Health   Financial Resource Strain: Not on file  Food Insecurity: Not on file  Transportation Needs: Not on file  Physical Activity: Not on file  Stress: Not on file  Social Connections: Not on file   Intimate Partner Violence: Not on file     Physical Exam   Vitals:   10/28/20 0247 10/28/20 0545  BP: 122/77 (!) 148/76  Pulse: 72 76  Resp: 16 19  Temp: 98 F (36.7 C)   SpO2: 100% 100%    CONSTITUTIONAL: Well-appearing, NAD NEURO:  Alert and oriented x 3, normal and symmetric strength and sensation, normal coordination, normal speech EYES:  eyes equal and reactive ENT/NECK:  no LAD, no JVD CARDIO: Regular rate, well-perfused, normal S1 and S2 PULM:  CTAB no wheezing or rhonchi GI/GU:  normal bowel sounds, non-distended, non-tender MSK/SPINE:  No gross deformities, no edema SKIN:  no rash, atraumatic PSYCH:  Appropriate speech and behavior  *Additional and/or pertinent findings included in MDM below  Diagnostic and Interventional Summary    EKG Interpretation  Date/Time:  Friday Oct 27 2020 22:26:11 EDT Ventricular Rate:  101 PR Interval:  166 QRS Duration: 80 QT Interval:  342 QTC Calculation: 443 R Axis:   35 Text Interpretation: Sinus tachycardia Otherwise normal ECG Confirmed by Gerlene Fee [phone]) on 10/28/2020 5:56:48 AM      Labs Reviewed  RESP PANEL BY RT-PCR (FLU A&B, COVID) ARPGX2 - Abnormal; Notable for the following components:      Result Value   SARS Coronavirus 2  by RT PCR POSITIVE (*)    All other components within normal limits  CBC WITH DIFFERENTIAL/PLATELET - Abnormal; Notable for the following components:   Lymphs Abs 0.5 (*)    All other components within normal limits  BASIC METABOLIC PANEL - Abnormal; Notable for the following components:   Sodium 133 (*)    CO2 21 (*)    Glucose, Bld 270 (*)    Calcium 8.6 (*)    All other components within normal limits  TROPONIN I (HIGH SENSITIVITY)  TROPONIN I (HIGH SENSITIVITY)    DG Chest 2 View  Final Result      Medications - No data to display   Procedures  /  Critical Care Procedures  ED Course and Medical Decision Making  I have reviewed the triage vital signs, the nursing  notes, and pertinent available records from the EMR.  Listed above are laboratory and imaging tests that I personally ordered, reviewed, and interpreted and then considered in my medical decision making (see below for details).  Constellation of symptoms likely explained by COVID-19, has tested positive here in the emergency department.  Chest pain and shortness of breath are endorsed, EKG is reassuring, troponin negative, no evidence of DVT, no hypoxia, doubt PE.  Headache also favored to be due to virus, very reassuring neurological exam.  No hypoxia, no indication for further testing or admission, appropriate for discharge with return precautions.     Christopher Hayden was evaluated in Emergency Department on 10/28/2020 for the symptoms described in the history of present illness. He was evaluated in the context of the global COVID-19 pandemic, which necessitated consideration that the patient might be at risk for infection with the SARS-CoV-2 virus that causes COVID-19. Institutional protocols and algorithms that pertain to the evaluation of patients at risk for COVID-19 are in a state of rapid change based on information released by regulatory bodies including the CDC and federal and state organizations. These policies and algorithms were followed during the patient's care in the ED.   Barth Kirks. Sedonia Small, MD [place] [email or web address]  Final Clinical Impressions(s) / ED Diagnoses     ICD-10-CM   1. Chest pain, unspecified type  R07.9   2. Chest pain  R07.9 DG Chest 2 View    DG Chest 2 View  3. COVID-19  U07.1     ED Discharge Orders         Ordered    Ambulatory referral for Covid Treatment        10/28/20 0631           Discharge Instructions Discussed with and Provided to Patient:     Discharge Instructions     You were evaluated in the Emergency Department and after careful evaluation, we did not find any emergent condition  requiring admission or further testing in the hospital.  Your exam/testing today was overall reassuring.  Symptoms seem to be due to COVID-19.  You have tested positive here in the emergency department.  As discussed, you will need to isolate at home for the next 5 days per [facility] recommendations.  Recommend Tylenol or Motrin for discomfort, plenty of fluids.  The infusion center may call you for outpatient antibiotic therapy.  Please return to the Emergency Department if you experience any worsening of your condition.  Thank you for allowing [place] to be a part of your care.       Maudie Flakes, MD 10/28/20 [phone]

## 2020-10-29 ENCOUNTER — Encounter: Payer: Self-pay | Admitting: Nurse Practitioner

## 2020-10-29 ENCOUNTER — Other Ambulatory Visit: Payer: Self-pay | Admitting: Nurse Practitioner

## 2020-10-29 DIAGNOSIS — U071 COVID-19: Secondary | ICD-10-CM

## 2020-10-29 MED ORDER — NIRMATRELVIR/RITONAVIR (PAXLOVID)TABLET: 3.0000 | ORAL_TABLET | Freq: Two times a day (BID) | ORAL | 0 refills | Status: AC

## 2020-10-29 NOTE — Progress Notes (Signed)
Outpatient Oral COVID Treatment Note  I connected with Christopher Hayden on 10/29/2020/12:05 PM by telephone and verified that I am speaking with the correct person using two identifiers.  I discussed the limitations, risks, security, and privacy concerns of performing an evaluation and management service by telephone and the availability of in person appointments. I also discussed with the patient that there may be a patient responsible charge related to this service. The patient expressed understanding and agreed to proceed.  Patient location: home  Provider location: office  Diagnosis: COVID-19 infection  Purpose of visit: Discussion of potential use of Molnupiravir or Paxlovid, a new treatment for mild to moderate COVID-19 viral infection in non-hospitalized patients.   Subjective: Patient is a 65 y.o. male who has been diagnosed with COVID 19 viral infection.  Their symptoms began on 10/28/3031 with congestion, sore throat, body aches.  Past Medical History:  Diagnosis Date  . Diabetes mellitus without complication ([place])   . High cholesterol   . Kidney stone     Allergies  Allergen Reactions  . Metformin And Related Other (See Comments)    Gave heart attack symptoms   . Meloxicam Other (See Comments)    Severe stomach cramps   . Naproxen Other (See Comments)    Made him feel like he was burning inside   . Zetia [Ezetimibe] Other (See Comments)    Made him feel like he was burning inside   . Toradol [Ketorolac Tromethamine]      Current Outpatient Medications:  .  amoxicillin (AMOXIL) 500 MG capsule, Take 2 capsules (1,000 mg total) by mouth 2 (two) times daily., Disp: 28 capsule, Rfl: 0 .  aspirin (ASPIRIN EC) 81 MG EC tablet, Take 81 mg by mouth at bedtime. , Disp: , Rfl:  .  cetirizine (ZYRTEC ALLERGY) 10 MG tablet, Take 1 tablet (10 mg total) by mouth daily., Disp: 7 tablet, Rfl: 0 .  ciprofloxacin (CIPRO) 500 MG tablet, Take 1 tablet (500 mg total) by mouth 2 (two) times  daily. One po bid x 7 days, Disp: 20 tablet, Rfl: 0 .  cyclobenzaprine (FLEXERIL) 10 MG tablet, Take 10 mg by mouth every 8 (eight) hours as needed. for pain, Disp: , Rfl:  .  HYDROcodone-acetaminophen (NORCO/VICODIN) 5-325 MG per tablet, Take 1-2 tablets by mouth every 6 (six) hours as needed for moderate pain. (Patient not taking: Reported on 06/08/2014), Disp: 20 tablet, Rfl: 0 .  insulin aspart (NOVOLOG) 100 UNIT/ML injection, Inject 5-24 Units into the skin 3 (three) times daily with meals. On Sliding Scale, Disp: , Rfl:  .  Insulin Glargine (BASAGLAR KWIKPEN) 100 UNIT/ML SOPN, Use as directed. Max daily dose is 50 units, Disp: , Rfl:  .  insulin glargine (LANTUS) 100 UNIT/ML injection, Inject 10-26 Units into the skin 2 (two) times daily. Takes 10 units with breakfast and 26 units at bedtime, Disp: , Rfl:  .  ondansetron (ZOFRAN ODT) 8 MG disintegrating tablet, Take 1 tablet (8 mg total) by mouth every 8 (eight) hours as needed. (Patient not taking: Reported on 06/08/2014), Disp: 10 tablet, Rfl: 0 .  oxycodone (OXY-IR) 5 MG capsule, Take 5 mg by mouth every 4 (four) hours as needed., Disp: , Rfl:  .  oxyCODONE-acetaminophen (PERCOCET/ROXICET) 5-325 MG tablet, Take 1 tablet by mouth every 4 (four) hours as needed for severe pain., Disp: 10 tablet, Rfl: 0 .  pravastatin (PRAVACHOL) 80 MG tablet, Take 80 mg by mouth at bedtime., Disp: , Rfl:  .  tetrahydrozoline (  VISINE) 0.05 % ophthalmic solution, Place 2 drops into both eyes daily as needed (for alleriges and dry eyes)., Disp: , Rfl:  .  traMADol (ULTRAM) 50 MG tablet, Take 1 tablet (50 mg total) by mouth every 6 (six) hours as needed., Disp: 15 tablet, Rfl: 0  Objective: Patient appears/sounds normal.  They are in no apparent distress.  Breathing is non labored.  Mood and behavior are normal.  Laboratory Data:  Recent Results (from the past 2160 hour(s))  CBC with Differential     Status: Abnormal   Collection Time: 10/27/20 10:19 PM  Result  Value Ref Range   WBC 5.6 4.0 - 10.5 K/uL   RBC 4.56 4.22 - 5.81 MIL/uL   Hemoglobin 14.1 13.0 - 17.0 g/dL   HCT 42.4 39.0 - 52.0 %   MCV 93.0 80.0 - 100.0 fL   MCH 30.9 26.0 - 34.0 pg   MCHC 33.3 30.0 - 36.0 g/dL   RDW 12.5 11.5 - 15.5 %   Platelets 221 150 - 400 K/uL   nRBC 0.0 0.0 - 0.2 %   Neutrophils Relative % 80 %   Neutro Abs 4.4 1.7 - 7.7 K/uL   Lymphocytes Relative 9 %   Lymphs Abs 0.5 (L) 0.7 - 4.0 K/uL   Monocytes Relative 10 %   Monocytes Absolute 0.6 0.1 - 1.0 K/uL   Eosinophils Relative 0 %   Eosinophils Absolute 0.0 0.0 - 0.5 K/uL   Basophils Relative 0 %   Basophils Absolute 0.0 0.0 - 0.1 K/uL   Immature Granulocytes 1 %   Abs Immature Granulocytes 0.03 0.00 - 0.07 K/uL    Comment: Performed at [facility], [address], [place], [place] [address]  Basic metabolic panel     Status: Abnormal   Collection Time: 10/27/20 10:19 PM  Result Value Ref Range   Sodium 133 (L) 135 - 145 mmol/L   Potassium 4.0 3.5 - 5.1 mmol/L   Chloride 102 98 - 111 mmol/L   CO2 21 (L) 22 - 32 mmol/L   Glucose, Bld 270 (H) 70 - 99 mg/dL    Comment: Glucose reference range applies only to samples taken after fasting for at least 8 hours.   BUN 17 8 - 23 mg/dL   Creatinine, Ser 1.07 0.61 - 1.24 mg/dL   Calcium 8.6 (L) 8.9 - 10.3 mg/dL   GFR, Estimated >60 >60 mL/min    Comment: (NOTE) Calculated using the CKD-EPI Creatinine Equation (2021)    Anion gap 10 5 - 15    Comment: Performed at [facility] [address], [place], [place] [address]  Troponin I (High Sensitivity)     Status: None   Collection Time: 10/27/20 10:19 PM  Result Value Ref Range   Troponin I (High Sensitivity) 12 <18 ng/L    Comment: (NOTE) Elevated high sensitivity troponin I (hsTnI) values and significant  changes across serial measurements may suggest ACS but many other  chronic and acute conditions are known to elevate hsTnI results.  Refer to the "Links" section for chest pain algorithms  and additional  guidance. Performed at [facility], [place] [address] [place], [place] [address]   Resp Panel by RT-PCR (Flu A&B, Covid) Nasopharyngeal Swab     Status: Abnormal   Collection Time: 10/27/20 10:23 PM   Specimen: Nasopharyngeal Swab; Nasopharyngeal(NP) swabs in vial transport medium  Result Value Ref Range   SARS Coronavirus 2 by RT PCR POSITIVE (A) NEGATIVE  Comment: RESULT CALLED TO, READ BACK BY AND VERIFIED WITH: RN MELISSA C [id number] (NOTE) SARS-CoV-2 target nucleic acids are DETECTED.  The SARS-CoV-2 RNA is generally detectable in upper respiratory specimens during the acute phase of infection. Positive results are indicative of the presence of the identified virus, but do not rule out bacterial infection or co-infection with other pathogens not detected by the test. Clinical correlation with patient history and other diagnostic information is necessary to determine patient infection status. The expected result is Negative.  Fact Sheet for Patients: BloggerCourse.comhttps://www.fda.gov/media/152166/download  Fact Sheet for Healthcare Providers: SeriousBroker.ithttps://www.fda.gov/media/152162/download  This test is not yet approved or cleared by the [place] FDA and  has been authorized for detection and/or diagnosis of SARS-CoV-2 by FDA under an Emergency Use Authorization (EUA).  This EUA will remain in effect (meaning this test can be used ) for the duration of  the COVID-19 declaration under Section 564(b)(1) of the Act, 21 U.S.C. section 360bbb-3(b)(1), unless the authorization is terminated or revoked sooner.     Influenza A by PCR NEGATIVE NEGATIVE   Influenza B by PCR NEGATIVE NEGATIVE    Comment: (NOTE) The Xpert Xpress SARS-CoV-2/FLU/RSV plus assay is intended as an aid in the diagnosis of influenza from Nasopharyngeal swab specimens and should not be used as a sole basis for treatment. Nasal washings and aspirates are unacceptable for Xpert Xpress  SARS-CoV-2/FLU/RSV testing.  Fact Sheet for Patients: BloggerCourse.comhttps://www.fda.gov/media/152166/download  Fact Sheet for Healthcare Providers: SeriousBroker.ithttps://www.fda.gov/media/152162/download  This test is not yet approved or cleared by the [place] FDA and has been authorized for detection and/or diagnosis of SARS-CoV-2 by FDA under an Emergency Use Authorization (EUA). This EUA will remain in effect (meaning this test can be used) for the duration of the COVID-19 declaration under Section 564(b)(1) of the Act, 21 U.S.C. section 360bbb-3(b)(1), unless the authorization is terminated or revoked.  Performed at [facility], [address] [place], [place] [address]   Troponin I (High Sensitivity)     Status: None   Collection Time: 10/28/20 12:35 AM  Result Value Ref Range   Troponin I (High Sensitivity) 12 <18 ng/L    Comment: (NOTE) Elevated high sensitivity troponin I (hsTnI) values and significant  changes across serial measurements may suggest ACS but many other  chronic and acute conditions are known to elevate hsTnI results.  Refer to the "Links" section for chest pain algorithms and additional  guidance. Performed at [facility], [address] [place], [place] [address]      Assessment: 65 y.o. male with mild/moderate COVID 19 viral infection diagnosed on 10/28/20 at high risk for progression to severe COVID 19.  Plan:  This patient is a 65 y.o. male that meets the following criteria for Emergency Use Authorization of: Paxlovid 1. Age >12 yr AND > 40 kg 2. SARS-COV-2 positive test 3. Symptom onset < 5 days 4. Mild-to-moderate COVID disease with high risk for severe progression to hospitalization or death  I have spoken and communicated the following to the patient or parent/caregiver regarding: 1. Paxlovid is an unapproved drug that is authorized for use under an Emergency Use Authorization.  2. There are no adequate, approved, available products for the  treatment of COVID-19 in adults who have mild-to-moderate COVID-19 and are at high risk for progressing to severe COVID-19, including hospitalization or death. 3. Other therapeutics are currently authorized. For additional information on all products authorized for treatment or prevention of COVID-19, please see https://www.graham-miller.com/https://www.fda.gov/emergencypreparedness-and-response/mcm-legal-regulatory-and-policyframework/emergency-use-authorization.  4. There are benefits and risks of taking this treatment as outlined in the "Fact Sheet for Patients and Caregivers."  5. "Fact Sheet for Patients and Caregivers" was reviewed with patient. A hard copy will be provided to patient from pharmacy prior to the patient receiving treatment. 6. Patients should continue to self-isolate and use infection control measures (e.g., wear mask, isolate, social distance, avoid sharing personal items, clean and disinfect "high touch" surfaces, and frequent handwashing) according to CDC guidelines.  7. The patient or parent/caregiver has the option to accept or refuse treatment. 8. Patient medication history was reviewed for potential drug interactions:Interaction with home meds: instructed to hold Prevastatin 9. Patient's GFR was calculated to be >60, and they were therefore prescribed Normal dose (GFR>60) - nirmatrelvir 150mg  tab (2 tablet) by mouth twice daily AND ritonavir 100mg  tab (1 tablet) by mouth twice daily   After reviewing above information with the patient, the patient agrees to receive Paxlovid.  Follow up instructions:    . Take prescription BID x 5 days as directed . Reach out to pharmacist for counseling on medication if desired . For concerns regarding further COVID symptoms please follow up with your PCP or urgent care . For urgent or life-threatening issues, seek care at your local emergency department  The patient was provided an opportunity to ask questions, and all were answered. The patient agreed with the  plan and demonstrated an understanding of the instructions.   Script sent to [facility] as requested and opted to pick up RX.  The patient was advised to call their PCP or seek an in-person evaluation if the symptoms worsen or if the condition fails to improve as anticipated.   I provided 25  minutes of non face-to-face telephone visit time during this encounter, and > 50% was spent counseling as documented under my assessment & plan.  Jobe Gibbon, NP 10/29/2020 /12:05 PM

## 2021-07-13 ENCOUNTER — Other Ambulatory Visit: Payer: Self-pay

## 2021-10-06 IMAGING — CR DG CHEST 2V
2 series · 2 of 2 positions shown · non-contrast
Comparison: 04/30/2018

CLINICAL DATA: Chest pain for 2 days

EXAM:
CHEST - 2 VIEW

[chest pa]
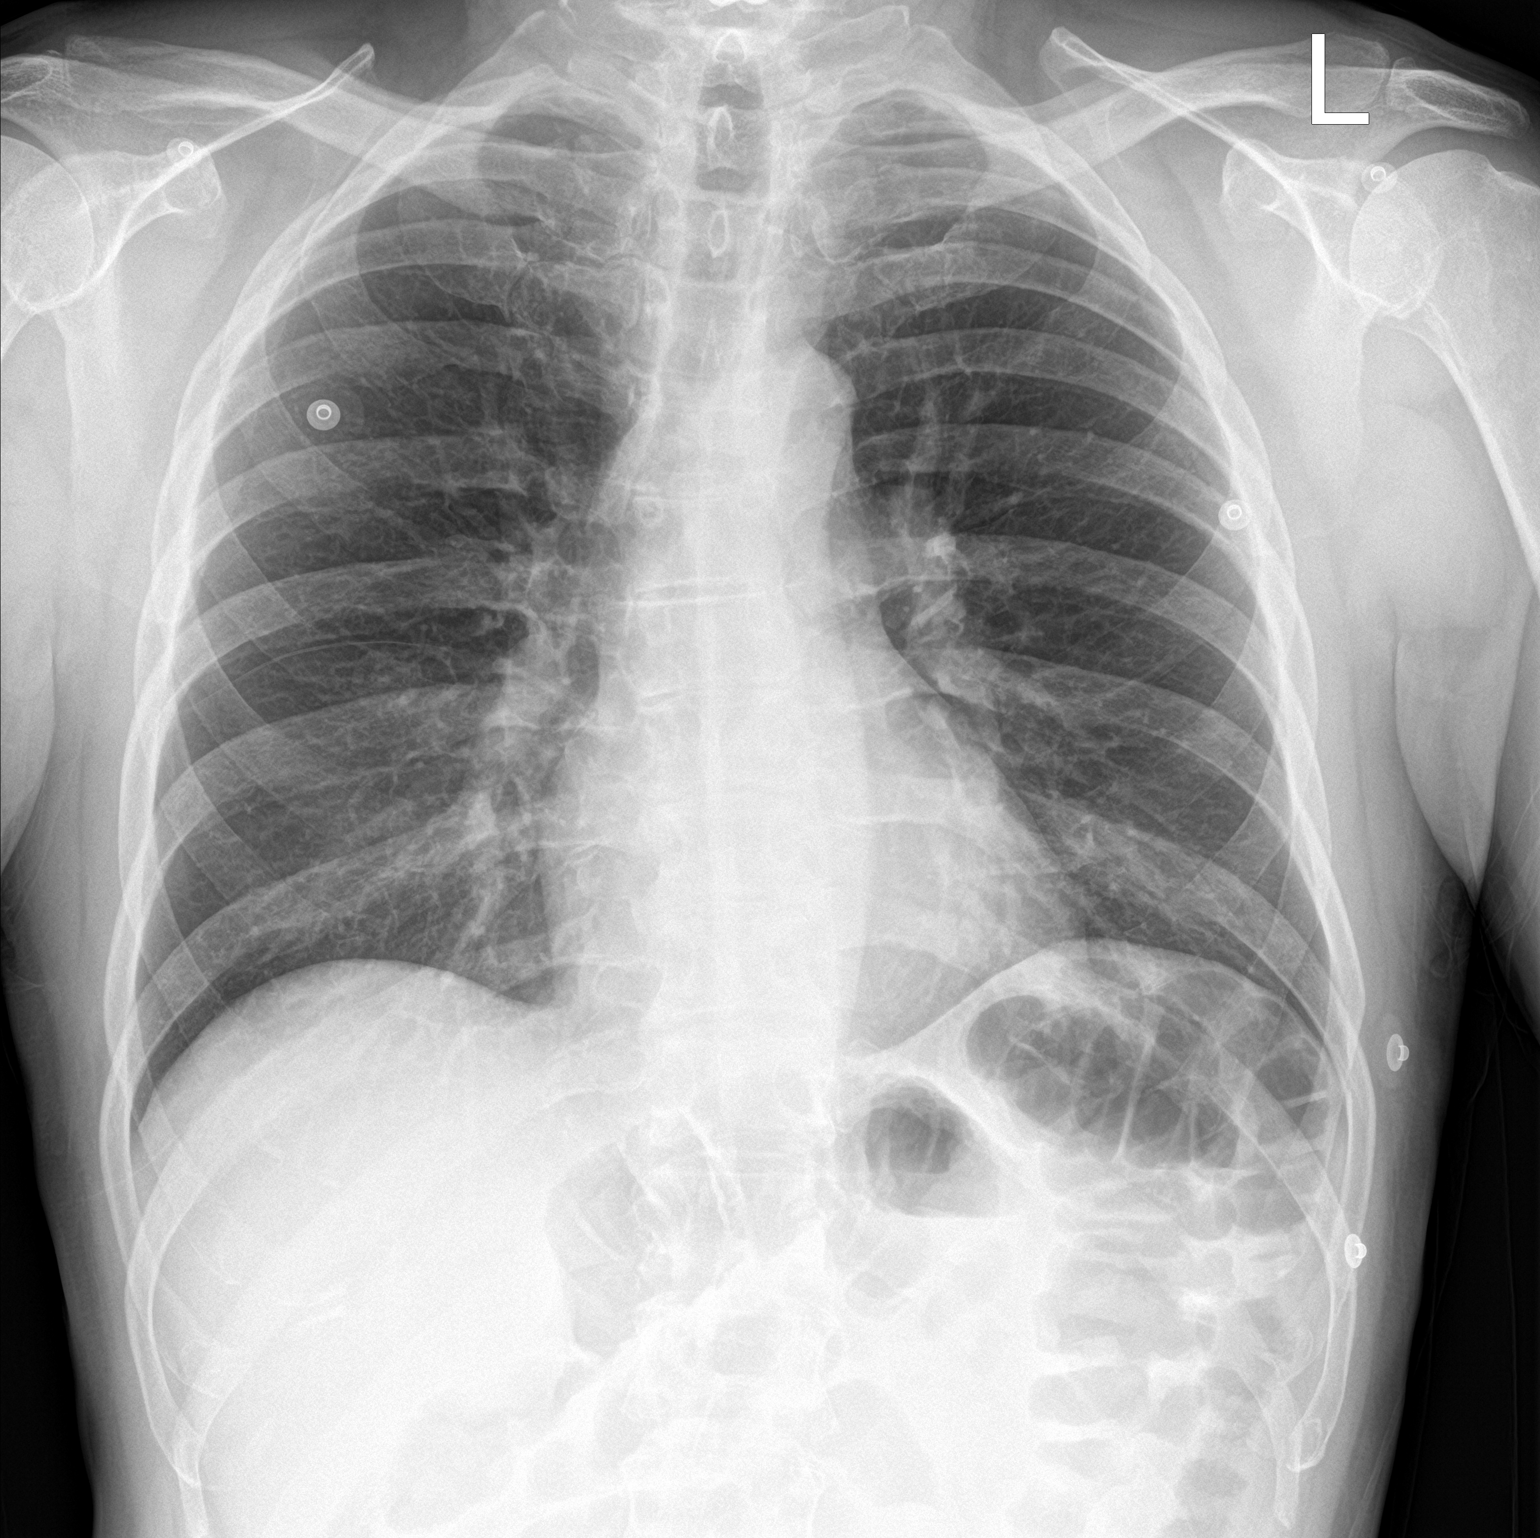

[chest lat]
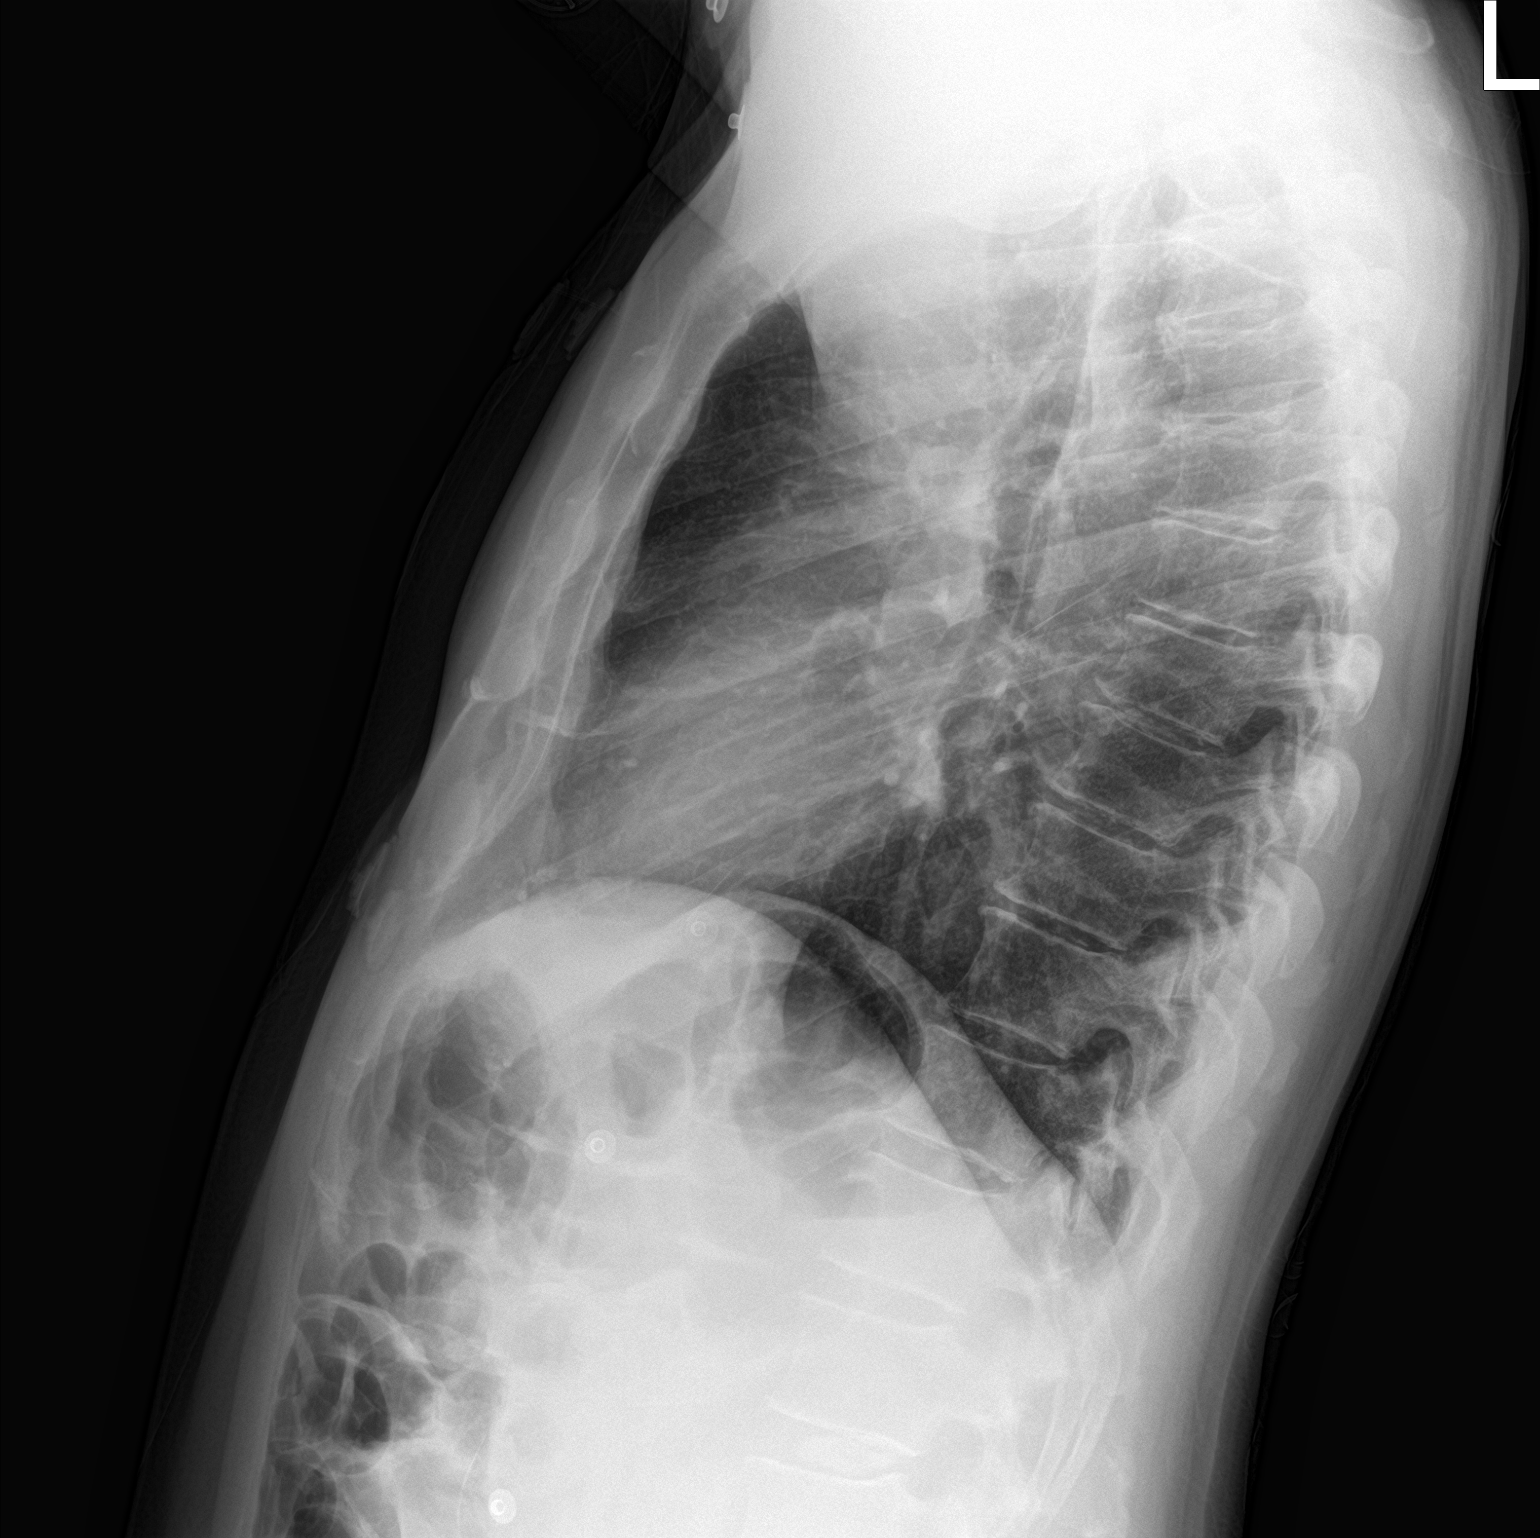

[2 of 2 positions shown; findings below may reference images not displayed]

FINDINGS: Cardiac shadow is within normal limits. The lungs are well aerated
bilaterally. No focal infiltrate or effusion is seen. Degenerative
changes of the thoracic spine are noted.
IMPRESSION: No acute abnormality noted.

## 2023-01-17 ENCOUNTER — Other Ambulatory Visit: Payer: Self-pay

## 2023-01-17 ENCOUNTER — Emergency Department (HOSPITAL_BASED_OUTPATIENT_CLINIC_OR_DEPARTMENT_OTHER): Payer: Medicare Other

## 2023-01-17 ENCOUNTER — Emergency Department (HOSPITAL_BASED_OUTPATIENT_CLINIC_OR_DEPARTMENT_OTHER)
Admission: EM | Admit: 2023-01-17 | Discharge: 2023-01-18 | Disposition: A | Discharge status: Home / Self Care | Payer: Medicare Other | Attending: Emergency Medicine | Admitting: Emergency Medicine

## 2023-01-17 DIAGNOSIS — Z794 Long term (current) use of insulin: Secondary | ICD-10-CM | POA: Diagnosis not present

## 2023-01-17 DIAGNOSIS — Z7982 Long term (current) use of aspirin: Secondary | ICD-10-CM | POA: Insufficient documentation

## 2023-01-17 DIAGNOSIS — U071 COVID-19: Secondary | ICD-10-CM | POA: Diagnosis not present

## 2023-01-17 DIAGNOSIS — E119 Type 2 diabetes mellitus without complications: Secondary | ICD-10-CM | POA: Diagnosis not present

## 2023-01-17 DIAGNOSIS — R0602 Shortness of breath: Secondary | ICD-10-CM | POA: Diagnosis present

## 2023-01-17 LAB — CBC
HCT: 42.8 % (ref 39.0–52.0)
Hemoglobin: 14.4 g/dL (ref 13.0–17.0)
MCH: 30.8 pg (ref 26.0–34.0)
MCHC: 33.6 g/dL (ref 30.0–36.0)
MCV: 91.5 fL (ref 80.0–100.0)
Platelets: 251 10*3/uL (ref 150–400)
RBC: 4.68 MIL/uL (ref 4.22–5.81)
RDW: 12.7 % (ref 11.5–15.5)
WBC: 7.1 10*3/uL (ref 4.0–10.5)
nRBC: 0 % (ref 0.0–0.2)

## 2023-01-17 LAB — BASIC METABOLIC PANEL
Anion gap: 9 (ref 5–15)
BUN: 23 mg/dL (ref 8–23)
CO2: 24 mmol/L (ref 22–32)
Calcium: 8.9 mg/dL (ref 8.9–10.3)
Chloride: 102 mmol/L (ref 98–111)
Creatinine, Ser: 1.2 mg/dL (ref 0.61–1.24)
GFR, Estimated: >60 mL/min (ref >60)
Glucose, Bld: 324 mg/dL — ABNORMAL HIGH (ref 70–99)
Potassium: 4.1 mmol/L (ref 3.5–5.1)
Sodium: 135 mmol/L (ref 135–145)

## 2023-01-17 LAB — SARS CORONAVIRUS 2 BY RT PCR: SARS Coronavirus 2 by RT PCR: POSITIVE — AB

## 2023-01-17 NOTE — ED Triage Notes (Signed)
Patient with generalized bodyaches, headache, fever and chills.  Patient states he was outside the other day and mowing yard and it started to ran.  He has felt like he has something mashing on his chest.  He states that he was having trouble taking a deep breath.  He does have a cough.

## 2023-01-17 NOTE — ED Provider Notes (Signed)
[facility] Provider Note   CSN: [id number] Arrival date & time: 01/17/23  2232     History {Add pertinent medical, surgical, social history, OB history to HPI:1} Chief Complaint  Patient presents with   Generalized Body Aches   Chest Pain    Christopher Hayden is a 67 y.o. male.  The history is provided by the patient and medical records.  Chest Pain Christopher Hayden is a 67 y.o. male who presents to the Emergency Department complaining of *** Flu like symptoms, achey, can't take a deep breath.  Sxs started yetserday with scratchy sore throat.  On wed mowing/weedeating, got wet due to diaphoresis and heat.  Then it was a cold rain but he continued to mow.   Has a cough starting tonight.  Has nausea. Chest tightness, no pain. No leg swelling. Had RLQ pain today.  No dysuria  No fever Had chills  Hx/o DM     Home Medications Prior to Admission medications   Medication Sig Start Date End Date Taking? Authorizing Provider  amoxicillin (AMOXIL) 500 MG capsule Take 2 capsules (1,000 mg total) by mouth 2 (two) times daily. 01/08/20   Gilda Crease, MD  aspirin (ASPIRIN EC) 81 MG EC tablet Take 81 mg by mouth at bedtime.     [provider]  cetirizine (ZYRTEC ALLERGY) 10 MG tablet Take 1 tablet (10 mg total) by mouth daily. 01/08/20   Gilda Crease, MD  ciprofloxacin (CIPRO) 500 MG tablet Take 1 tablet (500 mg total) by mouth 2 (two) times daily. One po bid x 7 days 07/20/18   Geoffery Lyons, MD  cyclobenzaprine (FLEXERIL) 10 MG tablet Take 10 mg by mouth every 8 (eight) hours as needed. for pain 07/18/18   [provider]  HYDROcodone-acetaminophen (NORCO/VICODIN) 5-325 MG per tablet Take 1-2 tablets by mouth every 6 (six) hours as needed for moderate pain. Patient not taking: Reported on 06/08/2014 06/19/13   [name], [name], MD  insulin aspart (NOVOLOG) 100 UNIT/ML injection Inject 5-24 Units into the skin 3 (three)  times daily with meals. On Sliding Scale    [provider]  Insulin Glargine (BASAGLAR KWIKPEN) 100 UNIT/ML SOPN Use as directed. Max daily dose is 50 units 08/05/18   [provider]  insulin glargine (LANTUS) 100 UNIT/ML injection Inject 10-26 Units into the skin 2 (two) times daily. Takes 10 units with breakfast and 26 units at bedtime    [provider]  ondansetron (ZOFRAN ODT) 8 MG disintegrating tablet Take 1 tablet (8 mg total) by mouth every 8 (eight) hours as needed. Patient not taking: Reported on 06/08/2014 06/19/13   [name], [name], MD  oxycodone (OXY-IR) 5 MG capsule Take 5 mg by mouth every 4 (four) hours as needed.    [provider]  oxyCODONE-acetaminophen (PERCOCET/ROXICET) 5-325 MG tablet Take 1 tablet by mouth every 4 (four) hours as needed for severe pain. 06/20/16   Jacalyn Lefevre, MD  pravastatin (PRAVACHOL) 80 MG tablet Take 80 mg by mouth at bedtime.    [provider]  tetrahydrozoline (VISINE) 0.05 % ophthalmic solution Place 2 drops into both eyes daily as needed (for alleriges and dry eyes).    [provider]  traMADol (ULTRAM) 50 MG tablet Take 1 tablet (50 mg total) by mouth every 6 (six) hours as needed. 01/16/17   Tilden Fossa, MD      Allergies    Metformin and related, Meloxicam, Naproxen, Zetia [ezetimibe],  and Toradol [ketorolac tromethamine]    Review of Systems   Review of Systems  Cardiovascular:  Positive for chest pain.    Physical Exam Updated Vital Signs BP (!) 141/86   Pulse 87   Temp 98.9 F (37.2 C) (Oral)   Resp 20   SpO2 99%  Physical Exam  ED Results / Procedures / Treatments   Labs (all labs ordered are listed, but only abnormal results are displayed) Labs Reviewed  SARS CORONAVIRUS 2 BY RT PCR  BASIC METABOLIC PANEL  CBC  TROPONIN I (HIGH SENSITIVITY)    EKG None  Radiology No results found.  Procedures Procedures  {Document cardiac monitor, telemetry assessment  procedure when appropriate:1}  Medications Ordered in ED Medications - No data to display  ED Course/ Medical Decision Making/ A&P   {   Click here for ABCD2, HEART and other calculatorsREFRESH Note before signing :1}                              Medical Decision Making Amount and/or Complexity of Data Reviewed Labs: ordered. Radiology: ordered.   ***  {Document critical care time when appropriate:1} {Document review of labs and clinical decision tools ie heart score, Chads2Vasc2 etc:1}  {Document your independent review of radiology images, and any outside records:1} {Document your discussion with family members, caretakers, and with consultants:1} {Document social determinants of health affecting pt's care:1} {Document your decision making why or why not admission, treatments were needed:1} Final Clinical Impression(s) / ED Diagnoses Final diagnoses:  None    Rx / DC Orders ED Discharge Orders     None

## 2023-01-18 MED ORDER — PAXLOVID (300/100) 20 X 150 MG & 10 X 100MG PO TBPK: 3.0000 | ORAL_TABLET | Freq: Two times a day (BID) | ORAL | 0 refills | Status: AC

## 2024-06-11 NOTE — Progress Notes (Signed)
 Sent message, via epic in basket, requesting orders in epic from Careers adviser.

## 2024-06-14 DIAGNOSIS — G8929 Other chronic pain: Secondary | ICD-10-CM

## 2024-06-14 NOTE — Patient Instructions (Addendum)
 SURGICAL WAITING ROOM VISITATION Patients having surgery or a procedure may have no more than 2 support people in the waiting area - these visitors may rotate in the visitor waiting room.   Due to an increase in RSV and influenza rates and associated hospitalizations, children ages 1 and under may not visit patients in [facility] hospitals. If the patient needs to stay at the hospital during part of their recovery, the visitor guidelines for inpatient rooms apply.  PRE-OP VISITATION  Pre-op nurse will coordinate an appropriate time for 1 support person to accompany the patient in pre-op.  This support person may not rotate.  This visitor will be contacted when the time is appropriate for the visitor to come back in the pre-op area.  Please refer to the [facility] website for the visitor guidelines for Inpatients (after your surgery is over and you are in a regular room).  You are not required to quarantine at this time prior to your surgery. However, you must do this: Hand Hygiene often Do NOT share personal items Notify your provider if you are in close contact with someone who has COVID or you develop fever 100.4 or greater, new onset of sneezing, cough, sore throat, shortness of breath or body aches.  If you test positive for Covid or have been in contact with anyone that has tested positive in the last 10 days please notify you surgeon.    Your procedure is scheduled on: 06/28/24   [facility]: [place] entrance where the [employer] is available.   Report to admitting at: 10:00 AM  Call this number if you have any questions or problems the morning of surgery [phone]  FOLLOW ANY ADDITIONAL PRE OP INSTRUCTIONS YOU RECEIVED FROM YOUR SURGEON'S OFFICE!!!  Do not eat food after Midnight the night prior to your surgery/procedure.  After Midnight you may have the following liquids until: 9:35 AM DAY OF SURGERY  Clear Liquid Diet Water Black  Coffee (sugar ok, NO MILK/CREAM OR CREAMERS)  Tea (sugar ok, NO MILK/CREAM OR CREAMERS) regular and decaf                             Plain Jell-O  with no fruit (NO RED)                                           Fruit ices (not with fruit pulp, NO RED)                                     Popsicles (NO RED)                                                                  Juice: NO CITRUS JUICES: only apple, WHITE grape, WHITE cranberry Sports drinks like Gatorade or Powerade (NO RED)   The day of surgery:  Drink ONE (1) Pre-Surgery Clear G2 at : 9:35 AM the morning of surgery. Drink in one sitting. Do not sip.  This drink was given  to you during your hospital pre-op appointment visit. Nothing else to drink after completing the Pre-Surgery Clear Ensure or G2 : No candy, chewing gum or throat lozenges.    Oral Hygiene is also important to reduce your risk of infection.        Remember - BRUSH YOUR TEETH THE MORNING OF SURGERY WITH YOUR REGULAR TOOTHPASTE  Do NOT smoke after Midnight the night before surgery.  STOP TAKING all Vitamins, Herbs and supplements 1 week before your surgery.   Take ONLY these medicines the morning of surgery with A SIP OF WATER: NONE. Tylenol  as needed.  How to Manage Your Diabetes Before and After Surgery  Why is it important to control my blood sugar before and after surgery? Improving blood sugar levels before and after surgery helps healing and can limit problems. A way of improving blood sugar control is eating a healthy diet by:  Eating less sugar and carbohydrates  Increasing activity/exercise  Talking with your doctor about reaching your blood sugar goals High blood sugars (greater than 180 mg/dL) can raise your risk of infections and slow your recovery, so you will need to focus on controlling your diabetes during the weeks before surgery. Make sure that the doctor who takes care of your diabetes knows about your planned surgery including the date and  location.  How do I manage my blood sugar before surgery? Check your blood sugar at least 4 times a day, starting 2 days before surgery, to make sure that the level is not too high or low. Check your blood sugar the morning of your surgery when you wake up and every 2 hours until you get to the Short Stay unit. If your blood sugar is less than 70 mg/dL, you will need to treat for low blood sugar: Do not take insulin. Treat a low blood sugar (less than 70 mg/dL) with  cup of clear juice (cranberry or apple), 4 glucose tablets, OR glucose gel. Recheck blood sugar in 15 minutes after treatment (to make sure it is greater than 70 mg/dL). If your blood sugar is not greater than 70 mg/dL on recheck, call [phone] for further instructions. Report your blood sugar to the short stay nurse when you get to Short Stay.  If you are admitted to the hospital after surgery: Your blood sugar will be checked by the staff and you will probably be given insulin after surgery (instead of oral diabetes medicines) to make sure you have good blood sugar levels. The goal for blood sugar control after surgery is 80-180 mg/dL.   WHAT DO I DO ABOUT MY DIABETES MEDICATION?  DO NOT take jardiance after: 06/24/24  THE NIGHT BEFORE SURGERY, take ONLY half of lantus dose (9 units). DO NOT take the Lispro insulin  night dose.      THE MORNING OF SURGERY, take only half of lantus dose (13 units) . If your CBG is greater than 220 mg/dL, you may take  of your sliding scale  (correction) dose of insulin (lispro).     If You have been diagnosed with Sleep Apnea - Bring CPAP mask and tubing day of surgery. We will provide you with a CPAP machine on the day of your surgery.                   You may not have any metal on your body including hair pins, jewelry, and body piercing  Do not wear lotions, powders, perfumes / cologne, or deodorant  Men  may shave face and neck.  Contacts, Hearing Aids, dentures or bridgework  may not be worn into surgery. DENTURES WILL BE REMOVED PRIOR TO SURGERY PLEASE DO NOT APPLY Poly grip OR ADHESIVES!!!  You may bring a small overnight bag with you on the day of surgery, only pack items that are not valuable. [facility] IS NOT RESPONSIBLE   FOR VALUABLES THAT ARE LOST OR STOLEN.   Patients discharged on the day of surgery will not be allowed to drive home.  Someone NEEDS to stay with you for the first 24 hours after anesthesia.  Do not bring your home medications to the hospital. The Pharmacy will dispense medications listed on your medication list to you during your admission in the Hospital.  Special Instructions: Bring a copy of your healthcare power of attorney and living will documents the day of surgery, if you wish to have them scanned into your [place] [facility] Records- EPIC  Please read over the following fact sheets you were given: IF YOU HAVE QUESTIONS ABOUT YOUR PRE-OP INSTRUCTIONS, PLEASE CALL [phone]  PATIENT SIGNATURE_________________________________  NURSE SIGNATURE__________________________________  ________________________________________________________________________  Pre-operative 4 CHG Bath Instructions  DYNA-Hex 4 Chlorhexidine Gluconate 4% Solution Antiseptic 4 fl. oz   You can play a key role in reducing the risk of infection after surgery. Your skin needs to be as free of germs as possible. You can reduce the number of germs on your skin by washing with CHG (chlorhexidine gluconate) soap before surgery. CHG is an antiseptic soap that kills germs and continues to kill germs even after washing.   DO NOT use if you have an allergy to chlorhexidine/CHG or antibacterial soaps. If your skin becomes reddened or irritated, stop using the CHG and notify one of our RNs at   Please shower with the CHG soap starting 4 days before surgery using the following schedule:     Please keep in mind the following:  DO NOT shave, including legs and  underarms, starting the day of your first shower.   You may shave your face at any point before/day of surgery.  Place clean sheets on your bed the day you start using CHG soap. Use a clean washcloth (not used since being washed) for each shower. DO NOT sleep with pets once you start using the CHG.  CHG Shower Instructions:  If you choose to wash your hair and private area, wash first with your normal shampoo/soap.  After you use shampoo/soap, rinse your hair and body thoroughly to remove shampoo/soap residue.  Turn the water OFF and apply about 3 tablespoons (45 ml) of CHG soap to a CLEAN washcloth.  Apply CHG soap ONLY FROM YOUR NECK DOWN TO YOUR TOES (washing for 3-5 minutes)  DO NOT use CHG soap on face, private areas, open wounds, or sores.  Pay special attention to the area where your surgery is being performed.  If you are having back surgery, having someone wash your back for you may be helpful. Wait 2 minutes after CHG soap is applied, then you may rinse off the CHG soap.  Pat dry with a clean towel  Put on clean clothes/pajamas   If you choose to wear lotion, please use ONLY the CHG-compatible lotions on the back of this paper.     Additional instructions for the day of surgery: DO NOT APPLY any lotions, deodorants, cologne, or perfumes.   Put on clean/comfortable clothes.  Brush your teeth.  Ask your nurse before applying any prescription medications to the  skin.   CHG Compatible Lotions   Aveeno Moisturizing lotion  Cetaphil Moisturizing Cream  Cetaphil Moisturizing Lotion  Clairol Herbal Essence Moisturizing Lotion, Dry Skin  Clairol Herbal Essence Moisturizing Lotion, Extra Dry Skin  Clairol Herbal Essence Moisturizing Lotion, Normal Skin  Curel Age Defying Therapeutic Moisturizing Lotion with Alpha Hydroxy  Curel Extreme Care Body Lotion  Curel Soothing Hands Moisturizing Hand Lotion  Curel Therapeutic Moisturizing Cream, Fragrance-Free  Curel Therapeutic  Moisturizing Lotion, Fragrance-Free  Curel Therapeutic Moisturizing Lotion, Original Formula  Eucerin Daily Replenishing Lotion  Eucerin Dry Skin Therapy Plus Alpha Hydroxy Crme  Eucerin Dry Skin Therapy Plus Alpha Hydroxy Lotion  Eucerin Original Crme  Eucerin Original Lotion  Eucerin Plus Crme Eucerin Plus Lotion  Eucerin TriLipid Replenishing Lotion  Keri Anti-Bacterial Hand Lotion  Keri Deep Conditioning Original Lotion Dry Skin Formula Softly Scented  Keri Deep Conditioning Original Lotion, Fragrance Free Sensitive Skin Formula  Keri Lotion Fast Absorbing Fragrance Free Sensitive Skin Formula  Keri Lotion Fast Absorbing Softly Scented Dry Skin Formula  Keri Original Lotion  Keri Skin Renewal Lotion Keri Silky Smooth Lotion  Keri Silky Smooth Sensitive Skin Lotion  Nivea Body Creamy Conditioning Oil  Nivea Body Extra Enriched Lotion  Nivea Body Original Lotion  Nivea Body Sheer Moisturizing Lotion Nivea Crme  Nivea Skin Firming Lotion  NutraDerm 30 Skin Lotion  NutraDerm Skin Lotion  NutraDerm Therapeutic Skin Cream  NutraDerm Therapeutic Skin Lotion  ProShield Protective Hand Cream  Provon moisturizing lotion  Incentive Spirometer  An incentive spirometer is a tool that can help keep your lungs clear and active. This tool measures how well you are filling your lungs with each breath. Taking long deep breaths may help reverse or decrease the chance of developing breathing (pulmonary) problems (especially infection) following: A long period of time when you are unable to move or be active. BEFORE THE PROCEDURE  If the spirometer includes an indicator to show your best effort, your nurse or respiratory therapist will set it to a desired goal. If possible, sit up straight or lean slightly forward. Try not to slouch. Hold the incentive spirometer in an upright position. INSTRUCTIONS FOR USE  Sit on the edge of your bed if possible, or sit up as far as you can in bed or on a  chair. Hold the incentive spirometer in an upright position. Breathe out normally. Place the mouthpiece in your mouth and seal your lips tightly around it. Breathe in slowly and as deeply as possible, raising the piston or the ball toward the top of the column. Hold your breath for 3-5 seconds or for as long as possible. Allow the piston or ball to fall to the bottom of the column. Remove the mouthpiece from your mouth and breathe out normally. Rest for a few seconds and repeat Steps 1 through 7 at least 10 times every 1-2 hours when you are awake. Take your time and take a few normal breaths between deep breaths. The spirometer may include an indicator to show your best effort. Use the indicator as a goal to work toward during each repetition. After each set of 10 deep breaths, practice coughing to be sure your lungs are clear. If you have an incision (the cut made at the time of surgery), support your incision when coughing by placing a pillow or rolled up towels firmly against it. Once you are able to get out of bed, walk around indoors and cough well. You may stop using the incentive spirometer when  instructed by your caregiver.  RISKS AND COMPLICATIONS Take your time so you do not get dizzy or light-headed. If you are in pain, you may need to take or ask for pain medication before doing incentive spirometry. It is harder to take a deep breath if you are having pain. AFTER USE Rest and breathe slowly and easily. It can be helpful to keep track of a log of your progress. Your caregiver can provide you with a simple table to help with this. If you are using the spirometer at home, follow these instructions: SEEK MEDICAL CARE IF:  You are having difficultly using the spirometer. You have trouble using the spirometer as often as instructed. Your pain medication is not giving enough relief while using the spirometer. You develop fever of 100.5 F (38.1 C) or higher. SEEK IMMEDIATE MEDICAL CARE  IF:  You cough up bloody sputum that had not been present before. You develop fever of 102 F (38.9 C) or greater. You develop worsening pain at or near the incision site. MAKE SURE YOU:  Understand these instructions. Will watch your condition. Will get help right away if you are not doing well or get worse. Document Released: 10/07/2006 Document Revised: 08/19/2011 Document Reviewed: 12/08/2006 [facility] Patient Information 2014 [place], [place].   ________________________________________________________________________

## 2024-06-14 NOTE — H&P (Signed)
 PARTIAL VS TOTAL KNEE ADMISSION H&P  Patient is being admitted for left partial knee arthroplasty.  Subjective:  Chief Complaint:left knee pain.  HPI: Christopher Hayden, 69 y.o. male, has a history of pain and functional disability in the left knee due to arthritis and has failed non-surgical conservative treatments for greater than 12 weeks to includeNSAID's and/or analgesics, corticosteriod injections, viscosupplementation injections, use of assistive devices, and activity modification.  Onset of symptoms was gradual, starting >10 years ago with gradually worsening course since that time. The patient noted no past surgery on the left knee(s).  Patient currently rates pain in the left knee(s) at 10 out of 10 with activity. Patient has night pain, worsening of pain with activity and weight bearing, pain that interferes with activities of daily living, and pain with passive range of motion.  Patient has evidence of periarticular osteophytes and joint space narrowing by imaging studies. There is no active infection.  There are no active problems to display for this patient.  Past Medical History:  Diagnosis Date   Diabetes mellitus without complication (HCC)    High cholesterol    Kidney stone     Past Surgical History:  Procedure Laterality Date   BACK SURGERY     KNEE SURGERY     NECK SURGERY      Current Outpatient Medications  Medication Sig Dispense Refill Last Dose/Taking   acetaminophen  (TYLENOL ) 500 MG tablet Take 1,000 mg by mouth every 6 (six) hours as needed for moderate pain (pain score 4-6), mild pain (pain score 1-3) or headache.      empagliflozin (JARDIANCE) 25 MG TABS tablet Take 25 mg by mouth daily.      insulin glargine (LANTUS) 100 UNIT/ML injection Inject 18-26 Units into the skin See admin instructions. Takes 26 units with breakfast and 18 units at bedtime      insulin lispro (HUMALOG) 100 UNIT/ML injection Inject into the skin 3 (three) times daily before meals. Sliding  scale      pravastatin (PRAVACHOL) 80 MG tablet Take 80 mg by mouth at bedtime.      No current facility-administered medications for this visit.   Allergies[1]  Social History   Tobacco Use   Smoking status: Former   Smokeless tobacco: Never  Substance Use Topics   Alcohol use: No    No family history on file.   Review of Systems  Musculoskeletal:  Positive for arthralgias.  All other systems reviewed and are negative.   Objective:  Physical Exam Constitutional:      General: He is not in acute distress.    Appearance: Normal appearance. He is normal weight.  HENT:     Head: Normocephalic and atraumatic.  Eyes:     Extraocular Movements: Extraocular movements intact.     Conjunctiva/sclera: Conjunctivae normal.     Pupils: Pupils are equal, round, and reactive to light.  Cardiovascular:     Rate and Rhythm: Normal rate and regular rhythm.     Pulses: Normal pulses.  Pulmonary:     Effort: Pulmonary effort is normal. No respiratory distress.  Abdominal:     General: There is no distension.     Palpations: Abdomen is soft.  Musculoskeletal:        General: Tenderness present.     Cervical back: Normal range of motion and neck supple.     Comments: TTP over medial joint line.  No calf tenderness, swelling, or erythema.  No overlying lesions of area of chief complaint.  Decreased strength and ROM due to elicited pain.  Dorsiflexion and plantarflexion intact.  Stable to varus and valgus stress.  BLE appear grossly neurovascularly intact.  Gait mildly antalgic.   Skin:    General: Skin is warm and dry.     Capillary Refill: Capillary refill takes less than 2 seconds.     Findings: No erythema or rash.  Neurological:     General: No focal deficit present.     Mental Status: He is alert and oriented to person, place, and time.  Psychiatric:        Mood and Affect: Mood normal.        Behavior: Behavior normal.     Vital signs in last 24  hours: @VSRANGES @  Labs:   Estimated body mass index is 27.67 kg/m as calculated from the following:   Height as of 10/27/20: 5' 11 (1.803 m).   Weight as of 10/27/20: 90 kg.   Imaging Review Plain radiographs demonstrate severe degenerative joint disease of the left knee(s), appearing isolated to the medial compartment. The overall alignment ismild varus. The bone quality appears to be fair for age and reported activity level.      Assessment/Plan:  End stage arthritis, left knee, medial compartment  The patient history, physical examination, clinical judgment of the provider and imaging studies are consistent with end stage degenerative joint disease of the left knee(s) and partial knee arthroplasty is deemed medically necessary. The treatment options including medical management, injection therapy arthroscopy and arthroplasty were discussed at length. The risks and benefits of total knee arthroplasty were presented and reviewed, including the possibility of transitioning plan from partial to total knee arthroplasty dependent on surgical findings. The risks due to aseptic loosening, infection, stiffness, patella tracking problems, thromboembolic complications and other imponderables were discussed. The patient acknowledged the explanation, agreed to proceed with the plan and consent was signed. Patient is being admitted for inpatient treatment for surgery, pain control, PT, OT, prophylactic antibiotics, VTE prophylaxis, progressive ambulation and ADL's and discharge planning. The patient is planning to be discharged home to OPPT    Anticipated LOS equal to or greater than 2 midnights due to - Age 59 and older with one or more of the following:  - Obesity  - Expected need for hospital services (PT, OT, Nursing) required for safe  discharge  - Anticipated need for postoperative skilled nursing care or inpatient rehab  - Active co-morbidities: IDDM, sarcoidosis, HLD, white coat  syndrome, insomnia, headaches, renal calculi OR   - Unanticipated findings during/Post Surgery: None  - Patient is a high risk of re-admission due to: None    [1]  Allergies Allergen Reactions   Metformin And Related Other (See Comments)    Gave heart attack symptoms    Meloxicam Other (See Comments)    Severe stomach cramps    Naproxen Other (See Comments)    Made him feel like he was burning inside    Zetia [Ezetimibe] Other (See Comments)    Made him feel like he was burning inside    Toradol [Ketorolac Tromethamine]

## 2024-06-15 ENCOUNTER — Other Ambulatory Visit: Payer: Self-pay

## 2024-06-15 ENCOUNTER — Encounter (HOSPITAL_COMMUNITY): Payer: Self-pay

## 2024-06-15 ENCOUNTER — Encounter (HOSPITAL_COMMUNITY)
Admission: RE | Admit: 2024-06-15 | Discharge: 2024-06-15 | Disposition: A | Discharge status: Home / Self Care | Source: Ambulatory Visit | Attending: Orthopedic Surgery | Admitting: Orthopedic Surgery

## 2024-06-15 VITALS — BP 134/81 | HR 72 | Temp 98.3°F | Ht 71.0 in | Wt 185.0 lb

## 2024-06-15 DIAGNOSIS — Z01818 Encounter for other preprocedural examination: Secondary | ICD-10-CM | POA: Diagnosis not present

## 2024-06-15 DIAGNOSIS — E119 Type 2 diabetes mellitus without complications: Secondary | ICD-10-CM | POA: Diagnosis not present

## 2024-06-15 DIAGNOSIS — G8929 Other chronic pain: Secondary | ICD-10-CM

## 2024-06-15 DIAGNOSIS — M25562 Pain in left knee: Secondary | ICD-10-CM | POA: Insufficient documentation

## 2024-06-15 DIAGNOSIS — Z794 Long term (current) use of insulin: Secondary | ICD-10-CM | POA: Diagnosis not present

## 2024-06-15 HISTORY — DX: Unspecified osteoarthritis, unspecified site: M19.90

## 2024-06-15 HISTORY — DX: Personal history of urinary calculi: Z87.442

## 2024-06-15 LAB — COMPREHENSIVE METABOLIC PANEL WITH GFR
ALT: 32 U/L (ref 0–44)
AST: 40 U/L (ref 15–41)
Albumin: 4.6 g/dL (ref 3.5–5.0)
Alkaline Phosphatase: 58 U/L (ref 38–126)
Anion gap: 8 (ref 5–15)
BUN: 18 mg/dL (ref 8–23)
CO2: 28 mmol/L (ref 22–32)
Calcium: 10.2 mg/dL (ref 8.9–10.3)
Chloride: 105 mmol/L (ref 98–111)
Creatinine, Ser: 0.87 mg/dL (ref 0.61–1.24)
GFR, Estimated: >60 mL/min
Glucose, Bld: 96 mg/dL (ref 70–99)
Potassium: 4.9 mmol/L (ref 3.5–5.1)
Sodium: 140 mmol/L (ref 135–145)
Total Bilirubin: 0.5 mg/dL (ref 0.0–1.2)
Total Protein: 7.3 g/dL (ref 6.5–8.1)

## 2024-06-15 LAB — CBC WITH DIFFERENTIAL/PLATELET
Abs Immature Granulocytes: 0.03 K/uL (ref 0.00–0.07)
Basophils Absolute: 0 K/uL (ref 0.0–0.1)
Basophils Relative: 0 %
Eosinophils Absolute: 0.1 K/uL (ref 0.0–0.5)
Eosinophils Relative: 1 %
HCT: 47.9 % (ref 39.0–52.0)
Hemoglobin: 15.9 g/dL (ref 13.0–17.0)
Immature Granulocytes: 0 %
Lymphocytes Relative: 22 %
Lymphs Abs: 2.1 K/uL (ref 0.7–4.0)
MCH: 31.3 pg (ref 26.0–34.0)
MCHC: 33.2 g/dL (ref 30.0–36.0)
MCV: 94.3 fL (ref 80.0–100.0)
Monocytes Absolute: 0.7 K/uL (ref 0.1–1.0)
Monocytes Relative: 8 %
Neutro Abs: 6.4 K/uL (ref 1.7–7.7)
Neutrophils Relative %: 69 %
Platelets: 317 K/uL (ref 150–400)
RBC: 5.08 MIL/uL (ref 4.22–5.81)
RDW: 12.8 % (ref 11.5–15.5)
WBC: 9.4 K/uL (ref 4.0–10.5)
nRBC: 0 % (ref 0.0–0.2)

## 2024-06-15 LAB — TYPE AND SCREEN
ABO/RH(D): A POS
Antibody Screen: NEGATIVE

## 2024-06-15 LAB — SURGICAL PCR SCREEN
MRSA, PCR: NEGATIVE
Staphylococcus aureus: NEGATIVE

## 2024-06-15 LAB — GLUCOSE, CAPILLARY: Glucose-Capillary: 97 mg/dL (ref 70–99)

## 2024-06-15 LAB — HEMOGLOBIN A1C
Hgb A1c MFr Bld: 8.5 % — ABNORMAL HIGH (ref 4.8–5.6)
Mean Plasma Glucose: 197.25 mg/dL

## 2024-06-15 NOTE — Progress Notes (Signed)
Lab results: A1C: 8.5

## 2024-06-15 NOTE — Progress Notes (Signed)
 For Anesthesia: PCP - Alray Loader, MD  Cardiologist - N/A  Bowel Prep reminder:  Chest x-ray -  EKG - 06/15/24 Stress Test -  ECHO -  Cardiac Cath -  Pacemaker/ICD device last checked: Pacemaker orders received: Device Rep notified:  Spinal Cord Stimulator:N/A  Sleep Study - N/A CPAP -   Fasting Blood Sugar - 120's - 140's Checks Blood Sugar : Freestyle continuous monitor Date and result of last Hgb A1c- 7.2: 03/17/24  Last dose of GLP1 agonist- N/A GLP1 instructions: Hold 7 days prior to schedule (Hold 24 hours-daily)   Last dose of SGLT-2 inhibitors- N/A SGLT-2 instructions: Hold 72 hours prior to surgery  Blood Thinner Instructions:N/A Last Dose: Time last taken:  Aspirin Instructions: Last Dose: Time last taken:  Activity level: Can go up a flight of stairs and activities of daily living without stopping and without chest pain and/or shortness of breath   Able to exercise without chest pain and/or shortness of breath  Anesthesia review:   Patient denies shortness of breath, fever, cough and chest pain at PAT appointment   Patient verbalized understanding of instructions that were reviewed over the telephone.

## 2024-06-28 ENCOUNTER — Ambulatory Visit (HOSPITAL_COMMUNITY): Admission: RE | Admit: 2024-06-28 | Source: Ambulatory Visit | Admitting: Orthopedic Surgery

## 2024-06-28 ENCOUNTER — Encounter (HOSPITAL_COMMUNITY): Admission: RE | Payer: Self-pay | Source: Ambulatory Visit

## 2024-06-28 SURGERY — ARTHROPLASTY, KNEE, UNICOMPARTMENTAL
Anesthesia: Spinal | Site: Knee | Laterality: Left
